# Patient Record
Sex: Female | Born: 1965 | Race: White | Hispanic: No | Marital: Single
Health system: Southern US, Community
[De-identification: ages and names within clinical notes are randomized; demographics above are authoritative.]

## PROBLEM LIST (undated history)

## (undated) DIAGNOSIS — M1990 Unspecified osteoarthritis, unspecified site: Secondary | ICD-10-CM

## (undated) DIAGNOSIS — G4733 Obstructive sleep apnea (adult) (pediatric): Secondary | ICD-10-CM

## (undated) DIAGNOSIS — N809 Endometriosis, unspecified: Secondary | ICD-10-CM

## (undated) DIAGNOSIS — F419 Anxiety disorder, unspecified: Secondary | ICD-10-CM

## (undated) HISTORY — PX: ABDOMINAL HYSTERECTOMY: SHX81

## (undated) HISTORY — PX: CHOLECYSTECTOMY: SHX55

## (undated) HISTORY — PX: OTHER SURGICAL HISTORY: SHX169

## (undated) HISTORY — PX: APPENDECTOMY: SHX54

## (undated) HISTORY — DX: Anxiety disorder, unspecified: F41.9

---
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
## 2019-04-03 DIAGNOSIS — M25571 Pain in right ankle and joints of right foot: Secondary | ICD-10-CM | POA: Diagnosis not present

## 2019-04-03 DIAGNOSIS — S99911A Unspecified injury of right ankle, initial encounter: Secondary | ICD-10-CM | POA: Diagnosis not present

## 2019-04-03 DIAGNOSIS — Z6828 Body mass index (BMI) 28.0-28.9, adult: Secondary | ICD-10-CM | POA: Diagnosis not present

## 2019-04-11 DIAGNOSIS — M9906 Segmental and somatic dysfunction of lower extremity: Secondary | ICD-10-CM | POA: Diagnosis not present

## 2019-04-11 DIAGNOSIS — M5137 Other intervertebral disc degeneration, lumbosacral region: Secondary | ICD-10-CM | POA: Diagnosis not present

## 2019-04-11 DIAGNOSIS — M25561 Pain in right knee: Secondary | ICD-10-CM | POA: Diagnosis not present

## 2019-04-11 DIAGNOSIS — M9903 Segmental and somatic dysfunction of lumbar region: Secondary | ICD-10-CM | POA: Diagnosis not present

## 2019-04-17 DIAGNOSIS — M9903 Segmental and somatic dysfunction of lumbar region: Secondary | ICD-10-CM | POA: Diagnosis not present

## 2019-04-17 DIAGNOSIS — M25561 Pain in right knee: Secondary | ICD-10-CM | POA: Diagnosis not present

## 2019-04-17 DIAGNOSIS — M9906 Segmental and somatic dysfunction of lower extremity: Secondary | ICD-10-CM | POA: Diagnosis not present

## 2019-04-17 DIAGNOSIS — M5137 Other intervertebral disc degeneration, lumbosacral region: Secondary | ICD-10-CM | POA: Diagnosis not present

## 2019-05-29 DIAGNOSIS — L700 Acne vulgaris: Secondary | ICD-10-CM | POA: Diagnosis not present

## 2019-05-29 DIAGNOSIS — L578 Other skin changes due to chronic exposure to nonionizing radiation: Secondary | ICD-10-CM | POA: Diagnosis not present

## 2019-05-29 DIAGNOSIS — Z1283 Encounter for screening for malignant neoplasm of skin: Secondary | ICD-10-CM | POA: Diagnosis not present

## 2019-06-02 DIAGNOSIS — M25579 Pain in unspecified ankle and joints of unspecified foot: Secondary | ICD-10-CM | POA: Diagnosis not present

## 2019-06-02 DIAGNOSIS — S93491A Sprain of other ligament of right ankle, initial encounter: Secondary | ICD-10-CM | POA: Diagnosis not present

## 2019-06-02 DIAGNOSIS — M79671 Pain in right foot: Secondary | ICD-10-CM | POA: Diagnosis not present

## 2019-06-16 DIAGNOSIS — M25579 Pain in unspecified ankle and joints of unspecified foot: Secondary | ICD-10-CM | POA: Diagnosis not present

## 2019-06-16 DIAGNOSIS — G5750 Tarsal tunnel syndrome, unspecified lower limb: Secondary | ICD-10-CM | POA: Diagnosis not present

## 2019-06-16 DIAGNOSIS — M79671 Pain in right foot: Secondary | ICD-10-CM | POA: Diagnosis not present

## 2019-06-27 DIAGNOSIS — G4733 Obstructive sleep apnea (adult) (pediatric): Secondary | ICD-10-CM | POA: Diagnosis not present

## 2019-07-07 DIAGNOSIS — M79671 Pain in right foot: Secondary | ICD-10-CM | POA: Diagnosis not present

## 2019-07-07 DIAGNOSIS — S93491D Sprain of other ligament of right ankle, subsequent encounter: Secondary | ICD-10-CM | POA: Diagnosis not present

## 2019-07-07 DIAGNOSIS — G5750 Tarsal tunnel syndrome, unspecified lower limb: Secondary | ICD-10-CM | POA: Diagnosis not present

## 2019-07-15 DIAGNOSIS — M25571 Pain in right ankle and joints of right foot: Secondary | ICD-10-CM | POA: Diagnosis not present

## 2019-07-15 DIAGNOSIS — M79671 Pain in right foot: Secondary | ICD-10-CM | POA: Diagnosis not present

## 2019-07-15 DIAGNOSIS — M25471 Effusion, right ankle: Secondary | ICD-10-CM | POA: Diagnosis not present

## 2019-07-22 DIAGNOSIS — S82899A Other fracture of unspecified lower leg, initial encounter for closed fracture: Secondary | ICD-10-CM | POA: Diagnosis not present

## 2019-07-22 DIAGNOSIS — H5213 Myopia, bilateral: Secondary | ICD-10-CM | POA: Diagnosis not present

## 2019-07-22 DIAGNOSIS — M25571 Pain in right ankle and joints of right foot: Secondary | ICD-10-CM | POA: Diagnosis not present

## 2019-08-19 DIAGNOSIS — M25571 Pain in right ankle and joints of right foot: Secondary | ICD-10-CM | POA: Diagnosis not present

## 2019-08-26 DIAGNOSIS — M5137 Other intervertebral disc degeneration, lumbosacral region: Secondary | ICD-10-CM | POA: Diagnosis not present

## 2019-08-26 DIAGNOSIS — M9903 Segmental and somatic dysfunction of lumbar region: Secondary | ICD-10-CM | POA: Diagnosis not present

## 2019-08-26 DIAGNOSIS — M9906 Segmental and somatic dysfunction of lower extremity: Secondary | ICD-10-CM | POA: Diagnosis not present

## 2019-08-26 DIAGNOSIS — M25561 Pain in right knee: Secondary | ICD-10-CM | POA: Diagnosis not present

## 2019-09-03 DIAGNOSIS — M5137 Other intervertebral disc degeneration, lumbosacral region: Secondary | ICD-10-CM | POA: Diagnosis not present

## 2019-09-03 DIAGNOSIS — M9903 Segmental and somatic dysfunction of lumbar region: Secondary | ICD-10-CM | POA: Diagnosis not present

## 2019-09-03 DIAGNOSIS — M9906 Segmental and somatic dysfunction of lower extremity: Secondary | ICD-10-CM | POA: Diagnosis not present

## 2019-09-03 DIAGNOSIS — M25561 Pain in right knee: Secondary | ICD-10-CM | POA: Diagnosis not present

## 2019-09-16 DIAGNOSIS — M79671 Pain in right foot: Secondary | ICD-10-CM | POA: Diagnosis not present

## 2019-09-16 DIAGNOSIS — M25571 Pain in right ankle and joints of right foot: Secondary | ICD-10-CM | POA: Diagnosis not present

## 2019-09-22 DIAGNOSIS — M25571 Pain in right ankle and joints of right foot: Secondary | ICD-10-CM | POA: Diagnosis not present

## 2019-09-25 ENCOUNTER — Encounter: Payer: Self-pay | Admitting: Physical Therapy

## 2019-09-25 ENCOUNTER — Ambulatory Visit: Payer: BC Managed Care – PPO | Attending: Orthopedic Surgery | Admitting: Physical Therapy

## 2019-09-25 ENCOUNTER — Other Ambulatory Visit: Payer: Self-pay

## 2019-09-25 DIAGNOSIS — R262 Difficulty in walking, not elsewhere classified: Secondary | ICD-10-CM | POA: Diagnosis present

## 2019-09-25 DIAGNOSIS — M25571 Pain in right ankle and joints of right foot: Secondary | ICD-10-CM | POA: Diagnosis present

## 2019-09-25 DIAGNOSIS — M25671 Stiffness of right ankle, not elsewhere classified: Secondary | ICD-10-CM

## 2019-09-25 NOTE — Therapy (Signed)
[facility] [address] [place], [place], [address] Phone: [phone]   Fax:  [phone]  Physical Therapy Evaluation  Patient Details  Name: Tiffany Mcintosh MRN: [id number] Date of Birth: 1966-03-10 Referring Provider (PT): Toni Arthurs, MD   Encounter Date: 09/25/2019  PT End of Session - 09/25/19 1243    Visit Number  1    Number of Visits  12    Date for PT Re-Evaluation  11/13/19    Authorization Type  FOTO; progress note every 10th visit    PT Start Time  1115    PT Stop Time  1202    PT Time Calculation (min)  47 min    Activity Tolerance  Patient tolerated treatment well    Behavior During Therapy  [facility] for tasks assessed/performed       History reviewed. No pertinent past medical history.  History reviewed. No pertinent surgical history.  There were no vitals filed for this visit.   Subjective Assessment - 09/25/19 1213    Subjective  COVID-19 screening performed upon arrival. Patient arrives with right ankle pain, decreased ROM, and difficulty walking due to a fall in late June 2020. Patient reported she twisted her ankle going down the steps and missed the last step and fell. Patient reports being in a boot and cast but reports being out weaned out of the boot. Patient reports pain and swelling particularly at the end of the day. Patient reports independence with ADLs but with pain. Patient has difficulties with step downs. Patient reports pain at worst as 7-8/10 and pain at best 4-5/10. Patient's goals are to decrease pain, improve movement, and improve ability to perform functional and work tasks.    Limitations  Standing;Walking;House hold activities    Diagnostic tests  x-ray: severe brusing of ankle; MRI: ligaments swollen per patient report    Patient Stated Goals  decrease pain, improve movement    Currently in Pain?  Yes    Pain Score  7     Pain Location  Ankle    Pain Orientation  Right    Pain Descriptors /  Indicators  Constant    Pain Type  Chronic pain    Pain Onset  More than a month ago    Pain Frequency  Constant    Aggravating Factors   walking    Pain Relieving Factors  motrin, rest    Effect of Pain on Daily Activities  "limited time on how long I can use it"         [facility] PT Assessment - 09/25/19 0001      Assessment   Medical Diagnosis  Pain of right ankle joint    Referring Provider (PT)  Toni Arthurs, MD    Onset Date/Surgical Date  --   June 2020   Next MD Visit  11/03/2019    Prior Therapy  no      Precautions   Precautions  None      Restrictions   Weight Bearing Restrictions  No      Balance Screen   Has the patient fallen in the past 6 months  Yes    How many times?  1    Has the patient had a decrease in activity level because of a fear of falling?   Yes    Is the patient reluctant to leave their home because of a fear of falling?   Yes      Home Environment   Living Environment  Private residence      Prior Function   Level of Independence  Independent with basic ADLs      Observation/Other Assessments   Focus on Therapeutic Outcomes (FOTO)   57% limitation      ROM / Strength   AROM / PROM / Strength  AROM;PROM      AROM   Overall AROM   Deficits    AROM Assessment Site  Ankle    Right/Left Ankle  Right    Right Ankle Dorsiflexion  -6    Right Ankle Plantar Flexion  48    Right Ankle Inversion  22    Right Ankle Eversion  10      PROM   Overall PROM   Deficits    PROM Assessment Site  Ankle    Right/Left Ankle  Right    Right Ankle Dorsiflexion  0    Right Ankle Plantar Flexion  52    Right Ankle Inversion  30    Right Ankle Eversion  14      Palpation   Palpation comment  Patient tender to palpation to medial aspect of right calf, slight tenderness to lateral ankle      Ambulation/Gait   Gait Pattern  Step-through pattern;Decreased stance time - right;Decreased step length - left;Decreased stride length;Decreased dorsiflexion -  right;Decreased weight shift to right    Ambulation Surface  Level;Indoor                Objective measurements completed on examination: See above findings.      [facility] Adult PT Treatment/Exercise - 09/25/19 0001      Modalities   Modalities  Electrical Stimulation;Vasopneumatic      Acupuncturist Location  right ankle    Electrical Stimulation Action  IFC    Electrical Stimulation Parameters  80-150 hz x10 mins    Electrical Stimulation Goals  Pain      Vasopneumatic   Number Minutes Vasopneumatic   10 minutes    Vasopnuematic Location   Ankle    Vasopneumatic Pressure  Low    Vasopneumatic Temperature   40             PT Education - 09/25/19 1242    Education Details  ankle pumps, ankle circles, seated heel raises, seated toe raises    Person(s) Educated  Patient    Methods  Explanation;Demonstration;Handout    Comprehension  Verbalized understanding;Returned demonstration          PT Long Term Goals - 09/25/19 1244      PT LONG TERM GOAL #1   Title  Patient will be independent with HEP    Time  6    Period  Weeks    Status  New      PT LONG TERM GOAL #2   Title  Patient will demonstrate 6+ degrees of right ankle DF AROM to improve gait mechanics.    Time  6    Period  Weeks    Status  New      PT LONG TERM GOAL #3   Title  Patient will report ability to walk community distances with minimal gait deviations and right ankle pain less than or equal to 4/10.    Time  6    Period  Weeks    Status  New      PT LONG TERM GOAL #4   Title  Patient will demonstrate 4+/5 or greater of right ankle MMT to  improve stability during functional tasks.    Time  6    Period  Weeks    Status  New      PT LONG TERM GOAL #5   Title  Patient will report ability to perform ADLs and work activities with pain less than or equal to 4/10.    Time  6    Period  Weeks    Status  New             Plan - 09/25/19 1402     Clinical Impression Statement  Patient is a 53 year old female who presents to physical therapy with right ankle pain, decreased right ankle ROM, and abnormal gait mechanics due to a fall in June 2020. Patient noted with increased right ankle edema in comparison to unaffected. Patient noted with tenderness in right medial aspect of the calf and slight tenderness in lateral ankle. Patient ambulates with a step through gait, decreased right stance time, decreased left stance time, and decreased right DF during toe off. Patient and PT discussed HEP as well as plan of care to address right ankle. Patient reported understanding. Patient would benefit from skilled physical therapy to address deficits and address patient's goals.    Personal Factors and Comorbidities  Age    Examination-Activity Limitations  Locomotion Level;Transfers;Squat;Stairs;Stand    Stability/Clinical Decision Making  Stable/Uncomplicated    Clinical Decision Making  Low    Rehab Potential  Good    PT Frequency  2x / week    PT Duration  6 weeks    PT Treatment/Interventions  ADLs/Self Care Home Management;Iontophoresis 4mg /ml Dexamethasone;Moist Heat;Ultrasound;Cryotherapy;Retail bankerlectrical Stimulation;Gait training;Stair training;Therapeutic activities;Functional mobility training;Therapeutic exercise;Balance training;Neuromuscular re-education;Patient/family education;Passive range of motion;Vasopneumatic Device;Taping;Manual techniques    PT Next Visit Plan  Nustep, right ankle AROM, PROM, gentle strengthening, modalities PRN for pain relief    PT Home Exercise Plan  see patient education section.    Consulted and Agree with Plan of Care  Patient       Patient will benefit from skilled therapeutic intervention in order to improve the following deficits and impairments:  Abnormal gait, Pain, Decreased activity tolerance, Decreased balance, Increased edema, Decreased strength, Difficulty walking, Decreased range of motion  Visit  Diagnosis: Stiffness of right ankle, not elsewhere classified - Plan: PT plan of care cert/re-cert  Pain in right ankle and joints of right foot - Plan: PT plan of care cert/re-cert  Difficulty in walking, not elsewhere classified - Plan: PT plan of care cert/re-cert     Problem List There are no active problems to display for this patient.   Guss BundeKrystle Jakhai Fant, PT, DPT 09/25/2019, 2:09 PM  [facility] [address] [place], [place], [address] Phone: [phone]   Fax:  [phone]  Name: Tiffany Mcintosh MRN: [id number] Date of Birth: 1965/12/27

## 2019-09-29 ENCOUNTER — Other Ambulatory Visit: Payer: Self-pay

## 2019-09-29 ENCOUNTER — Ambulatory Visit: Payer: BC Managed Care – PPO | Admitting: Physical Therapy

## 2019-09-29 DIAGNOSIS — M25671 Stiffness of right ankle, not elsewhere classified: Secondary | ICD-10-CM

## 2019-09-29 DIAGNOSIS — R262 Difficulty in walking, not elsewhere classified: Secondary | ICD-10-CM

## 2019-09-29 DIAGNOSIS — M25571 Pain in right ankle and joints of right foot: Secondary | ICD-10-CM

## 2019-09-29 NOTE — Therapy (Signed)
[facility] [place], [place], [address] Phone: [phone]   Fax:  [phone]  Physical Therapy Treatment  Patient Details  Name: Tiffany Mcintosh MRN: [id number] Date of Birth: February 18, 1966 Referring Provider (PT): Wylene Simmer, MD   Encounter Date: 09/29/2019  PT End of Session - 09/29/19 1131    Visit Number  2    Number of Visits  12    Date for PT Re-Evaluation  11/13/19    Authorization Type  FOTO; progress note every 10th visit    PT Start Time  1115    PT Stop Time  1206    PT Time Calculation (min)  51 min    Activity Tolerance  Patient tolerated treatment well    Behavior During Therapy  [facility] for tasks assessed/performed       No past medical history on file.  No past surgical history on file.  There were no vitals filed for this visit.  Subjective Assessment - 09/29/19 1129    Subjective  COVID-19 screening performed upon arrival. Patient reports ongoing pain in medial aspect of the right ankle. Patient reports pain can increase and she uses the cane fore support while walking to prevent falls.    Limitations  Standing;Walking;House hold activities    Diagnostic tests  x-ray: severe brusing of ankle; MRI: ligaments swollen per patient report    Patient Stated Goals  decrease pain, improve movement    Currently in Pain?  Yes    Pain Score  7     Pain Location  Ankle    Pain Orientation  Right;Medial    Pain Descriptors / Indicators  Sharp;Aching;Constant    Pain Type  Chronic pain    Pain Onset  More than a month ago    Pain Frequency  Constant         [facility] PT Assessment - 09/29/19 0001      Assessment   Medical Diagnosis  Pain of right ankle joint    Referring Provider (PT)  Wylene Simmer, MD    Next MD Visit  11/03/2019    Prior Therapy  no      Precautions   Precautions  None                   [facility] Adult PT Treatment/Exercise - 09/29/19 0001      Exercises   Exercises  Ankle       Modalities   Modalities  Electrical Stimulation;Vasopneumatic      Electrical Stimulation   Electrical Stimulation Location  right ankle     Electrical Stimulation Action  IFC    Electrical Stimulation Parameters  80-150 hz x10 mins    Electrical Stimulation Goals  Pain      Vasopneumatic   Number Minutes Vasopneumatic   10 minutes    Vasopnuematic Location   Ankle    Vasopneumatic Pressure  Low    Vasopneumatic Temperature   56      Manual Therapy   Manual Therapy  Soft tissue mobilization;Passive ROM    Soft tissue mobilization  STW/M distal to proximal pressure to decrease edema    Passive ROM  gentle PROM into DF to improve range.      Ankle Exercises: Aerobic   Nustep  Level 2 x10 mins, Seat 11 to 9      Ankle Exercises: Seated   Ankle Circles/Pumps  AROM;Right;20 reps    Ankle Circles/Pumps Limitations  on dynadisc    Other Seated  Ankle Exercises  rockerboard AP and lateral 3 mins each                  PT Long Term Goals - 09/25/19 1244      PT LONG TERM GOAL #1   Title  Patient will be independent with HEP    Time  6    Period  Weeks    Status  New      PT LONG TERM GOAL #2   Title  Patient will demonstrate 6+ degrees of right ankle DF AROM to improve gait mechanics.    Time  6    Period  Weeks    Status  New      PT LONG TERM GOAL #3   Title  Patient will report ability to walk community distances with minimal gait deviations and right ankle pain less than or equal to 4/10.    Time  6    Period  Weeks    Status  New      PT LONG TERM GOAL #4   Title  Patient will demonstrate 4+/5 or greater of right ankle MMT to improve stability during functional tasks.    Time  6    Period  Weeks    Status  New      PT LONG TERM GOAL #5   Title  Patient will report ability to perform ADLs and work activities with pain less than or equal to 4/10.    Time  6    Period  Weeks    Status  New            Plan - 09/29/19 1158    Clinical  Impression Statement  Patient responded well to therapy session with no reports of increased pain. Patient demonstrated good form with all seated exercises. Patient noted with increased right ankle swelling and decreased DF PROM. No adverse affects upon removal of modalities.    Personal Factors and Comorbidities  Age    Examination-Activity Limitations  Locomotion Level;Transfers;Squat;Stairs;Stand    Stability/Clinical Decision Making  Stable/Uncomplicated    Clinical Decision Making  Low    Rehab Potential  Good    PT Frequency  2x / week    PT Duration  6 weeks    PT Treatment/Interventions  ADLs/Self Care Home Management;Iontophoresis 4mg /ml Dexamethasone;Moist Heat;Ultrasound;Cryotherapy; ;Therapeutic activities;Functional mobility training;Therapeutic exercise;Balance training;Neuromuscular re-education;Patient/family education;Passive range of motion;Vasopneumatic Device;Taping;Manual techniques    PT Next Visit Plan  Nustep, right ankle AROM, PROM, gentle strengthening, modalities PRN for pain relief    Consulted and Agree with Plan of Care  Patient       Patient will benefit from skilled therapeutic intervention in order to improve the following deficits and impairments:  Abnormal gait, Pain, Decreased activity tolerance, Decreased balance, Increased edema, Decreased strength, Difficulty walking, Decreased range of motion  Visit Diagnosis: Stiffness of right ankle, not elsewhere classified  Pain in right ankle and joints of right foot  Difficulty in walking, not elsewhere classified     Problem List There are no active problems to display for this patient.   Retail banker, PT, DPT 09/29/2019, 12:16 PM  [facility] [address] [place], [place], [place] Phone: [phone]   Fax:  [phone]  Name: Tiffany Mcintosh MRN: [name] Date of Birth: September 28, 1966

## 2019-10-02 ENCOUNTER — Other Ambulatory Visit: Payer: Self-pay

## 2019-10-02 ENCOUNTER — Ambulatory Visit: Payer: BC Managed Care – PPO | Admitting: Physical Therapy

## 2019-10-02 ENCOUNTER — Encounter: Payer: Self-pay | Admitting: Physical Therapy

## 2019-10-02 DIAGNOSIS — M25671 Stiffness of right ankle, not elsewhere classified: Secondary | ICD-10-CM | POA: Diagnosis not present

## 2019-10-02 DIAGNOSIS — M25571 Pain in right ankle and joints of right foot: Secondary | ICD-10-CM

## 2019-10-02 DIAGNOSIS — R262 Difficulty in walking, not elsewhere classified: Secondary | ICD-10-CM

## 2019-10-02 NOTE — Therapy (Signed)
[facility] [address] [place], [place], [address] Phone: [phone]   Fax:  [phone]  Physical Therapy Treatment  Patient Details  Name: Tiffany Mcintosh MRN: [id number] Date of Birth: [birth year] Referring Provider (PT): Toni Arthurs, MD   Encounter Date: 10/02/2019  PT End of Session - 10/02/19 1118    Visit Number  3    Number of Visits  12    Date for PT Re-Evaluation  11/13/19    Authorization Type  FOTO; progress note every 10th visit    PT Start Time  1116    PT Stop Time  1204    PT Time Calculation (min)  48 min    Activity Tolerance  Patient tolerated treatment well    Behavior During Therapy  [facility] for tasks assessed/performed       History reviewed. No pertinent past medical history.  History reviewed. No pertinent surgical history.  There were no vitals filed for this visit.  Subjective Assessment - 10/02/19 1117    Subjective  COVID 19 screening performed on patient upon arrival. Patient reports ankle is better.    Limitations  Standing;Walking;House hold activities    Diagnostic tests  x-ray: severe brusing of ankle; MRI: ligaments swollen per patient report    Currently in Pain?  Yes    Pain Score  5     Pain Location  Ankle    Pain Orientation  Right;Medial    Pain Descriptors / Indicators  Constant;Aching;Sore;Tightness    Pain Type  Chronic pain    Pain Onset  More than a month ago    Pain Frequency  Constant         [facility] PT Assessment - 10/02/19 0001      Assessment   Medical Diagnosis  Pain of right ankle joint    Referring Provider (PT)  Toni Arthurs, MD    Next MD Visit  11/03/2019    Prior Therapy  no      Precautions   Precautions  None      Restrictions   Weight Bearing Restrictions  No                   [facility] Adult PT Treatment/Exercise - 10/02/19 0001      Modalities   Modalities  Electrical Stimulation;Vasopneumatic      Electrical Stimulation   Electrical  Stimulation Location  R ankle    Electrical Stimulation Action  IFC    Electrical Stimulation Parameters  80-150 hz x15 min    Electrical Stimulation Goals  Pain;Edema      Vasopneumatic   Number Minutes Vasopneumatic   15 minutes    Vasopnuematic Location   Ankle    Vasopneumatic Pressure  Low    Vasopneumatic Temperature   34      Ankle Exercises: Aerobic   Nustep  L3 x60min      Ankle Exercises: Seated   ABC's  1 rep    Heel Raises  Both;20 reps    Toe Raise  20 reps    Other Seated Ankle Exercises  Rockerboard ant/post x3 min, lat/med x3 min    Other Seated Ankle Exercises  Dyandisc ant/post x2 min, circles x2 min                  PT Long Term Goals - 09/25/19 1244      PT LONG TERM GOAL #1   Title  Patient will be independent with HEP    Time  6    Period  Weeks    Status  New      PT LONG TERM GOAL #2   Title  Patient will demonstrate 6+ degrees of right ankle DF AROM to improve gait mechanics.    Time  6    Period  Weeks    Status  New      PT LONG TERM GOAL #3   Title  Patient will report ability to walk community distances with minimal gait deviations and right ankle pain less than or equal to 4/10.    Time  6    Period  Weeks    Status  New      PT LONG TERM GOAL #4   Title  Patient will demonstrate 4+/5 or greater of right ankle MMT to improve stability during functional tasks.    Time  6    Period  Weeks    Status  New      PT LONG TERM GOAL #5   Title  Patient will report ability to perform ADLs and work activities with pain less than or equal to 4/10.    Time  6    Period  Weeks    Status  New            Plan - 10/02/19 1201    Clinical Impression Statement  Patient presented in clinic with reports of overall improvement since last treatment. Patient noted improvement in R ankle edema since previous PT treatment and edema massage. Patient reported intermittant R fifth metatarsal pain and aching into R posteriolateral calf.  Minimal to moderate edema still notable around B ankle malleoli and into inferior calf region. Normal modalities response noted following removal of the modalities.    Personal Factors and Comorbidities  Age    Examination-Activity Limitations  Locomotion Level;Transfers;Squat;Stairs;Stand    Stability/Clinical Decision Making  Stable/Uncomplicated    Rehab Potential  Good    PT Frequency  2x / week    PT Duration  6 weeks    PT Treatment/Interventions  ADLs/Self Care Home Management;Iontophoresis 4mg /ml Dexamethasone;Moist Heat;Ultrasound;Cryotherapy;Occupational psychologist;Therapeutic activities;Functional mobility training;Therapeutic exercise;Balance training;Neuromuscular re-education;Patient/family education;Passive range of motion;Vasopneumatic Device;Taping;Manual techniques    PT Next Visit Plan  Nustep, right ankle AROM, PROM, gentle strengthening, modalities PRN for pain relief    PT Home Exercise Plan  see patient education section.    Consulted and Agree with Plan of Care  Patient       Patient will benefit from skilled therapeutic intervention in order to improve the following deficits and impairments:  Abnormal gait, Pain, Decreased activity tolerance, Decreased balance, Increased edema, Decreased strength, Difficulty walking, Decreased range of motion  Visit Diagnosis: Stiffness of right ankle, not elsewhere classified  Pain in right ankle and joints of right foot  Difficulty in walking, not elsewhere classified     Problem List There are no active problems to display for this patient.   Standley Brooking, PTA 10/02/2019, 12:11 PM  [facility] [address] [place], [place], [address] Phone: [phone]   Fax:  [phone]  Name: Maris Abascal MRN: [id number] Date of Birth: 02/10/1966

## 2019-10-07 ENCOUNTER — Other Ambulatory Visit: Payer: Self-pay

## 2019-10-07 ENCOUNTER — Ambulatory Visit: Payer: BC Managed Care – PPO | Admitting: Physical Therapy

## 2019-10-07 ENCOUNTER — Encounter: Payer: Self-pay | Admitting: Physical Therapy

## 2019-10-07 DIAGNOSIS — M25671 Stiffness of right ankle, not elsewhere classified: Secondary | ICD-10-CM | POA: Diagnosis not present

## 2019-10-07 DIAGNOSIS — M25571 Pain in right ankle and joints of right foot: Secondary | ICD-10-CM

## 2019-10-07 DIAGNOSIS — R262 Difficulty in walking, not elsewhere classified: Secondary | ICD-10-CM

## 2019-10-07 NOTE — Therapy (Signed)
[facility] [place], [place], [address] Phone: [phone]   Fax:  [phone]  Physical Therapy Treatment  Patient Details  Name: Tiffany Mcintosh MRN: [id number] Date of Birth: 12-23-1965 Referring Provider (PT): Wylene Simmer, MD   Encounter Date: 10/07/2019  PT End of Session - 10/07/19 1246    Visit Number  4    Number of Visits  12    Date for PT Re-Evaluation  11/13/19    Authorization Type  FOTO; progress note every 10th visit    PT Start Time  1115    PT Stop Time  1202    PT Time Calculation (min)  47 min    Activity Tolerance  Patient tolerated treatment well    Behavior During Therapy  [facility] for tasks assessed/performed       History reviewed. No pertinent past medical history.  History reviewed. No pertinent surgical history.  There were no vitals filed for this visit.  Subjective Assessment - 10/07/19 1118    Subjective  COVID 19 screening performed on patient upon arrival. Patient reports ankle feels better for 3-4 days after PT then pain and discomfort returns.    Limitations  Standing;Walking;House hold activities    Diagnostic tests  x-ray: severe brusing of ankle; MRI: ligaments swollen per patient report    Patient Stated Goals  decrease pain, improve movement    Currently in Pain?  Yes    Pain Score  5     Pain Location  Ankle    Pain Orientation  Right;Medial    Pain Descriptors / Indicators  Constant    Pain Type  Chronic pain    Pain Onset  More than a month ago    Pain Frequency  Constant         [facility] PT Assessment - 10/07/19 0001      Assessment   Medical Diagnosis  Pain of right ankle joint    Referring Provider (PT)  Wylene Simmer, MD    Next MD Visit  11/03/2019    Prior Therapy  no      Precautions   Precautions  None                   [facility] Adult PT Treatment/Exercise - 10/07/19 0001      Modalities   Modalities  Electrical Stimulation;Vasopneumatic       Electrical Stimulation   Electrical Stimulation Location  R ankle    Electrical Stimulation Action  IFC    Electrical Stimulation Parameters  80-150 hz x15 mins    Electrical Stimulation Goals  Pain      Vasopneumatic   Number Minutes Vasopneumatic   15 minutes    Vasopnuematic Location   Ankle    Vasopneumatic Pressure  Low    Vasopneumatic Temperature   34      Ankle Exercises: Aerobic   Nustep  L5 x10 mins      Ankle Exercises: Seated   Other Seated Ankle Exercises  Rockerboard ant/post x3 min, lat/med x3 min      Ankle Exercises: Stretches   Soleus Stretch  4 reps;30 seconds    Gastroc Stretch  4 reps;30 seconds                  PT Long Term Goals - 09/25/19 1244      PT LONG TERM GOAL #1   Title  Patient will be independent with HEP    Time  6  Period  Weeks    Status  New      PT LONG TERM GOAL #2   Title  Patient will demonstrate 6+ degrees of right ankle DF AROM to improve gait mechanics.    Time  6    Period  Weeks    Status  New      PT LONG TERM GOAL #3   Title  Patient will report ability to walk community distances with minimal gait deviations and right ankle pain less than or equal to 4/10.    Time  6    Period  Weeks    Status  New      PT LONG TERM GOAL #4   Title  Patient will demonstrate 4+/5 or greater of right ankle MMT to improve stability during functional tasks.    Time  6    Period  Weeks    Status  New      PT LONG TERM GOAL #5   Title  Patient will report ability to perform ADLs and work activities with pain less than or equal to 4/10.    Time  6    Period  Weeks    Status  New            Plan - 10/07/19 1246    Clinical Impression Statement  Patient arrives with ongoing 5/10 pain. Patient was able to peform all TEs with minimal complaints. Patient and PT discussed new HEP consisting of towel scrunches and gastroc and soleus stretching. Patient demonstrated good form with stretching. Patient and PT discussed  continuing to ice and elevate at home to address pain and edema. Patient reported understanding. No adverse affects upon removal of modalities.    Personal Factors and Comorbidities  Age    Examination-Activity Limitations  Locomotion Level;Transfers;Squat;Stairs;Stand    Stability/Clinical Decision Making  Stable/Uncomplicated    Clinical Decision Making  Low    Rehab Potential  Good    PT Frequency  2x / week    PT Duration  6 weeks    PT Treatment/Interventions  ADLs/Self Care Home Management;Iontophoresis 4mg /ml Dexamethasone;Moist Heat;Ultrasound;Cryotherapy; ;Therapeutic activities;Functional mobility training;Therapeutic exercise;Balance training;Neuromuscular re-education;Patient/family education;Passive range of motion;Vasopneumatic Device;Taping;Manual techniques    PT Next Visit Plan  Nustep, right ankle AROM, PROM, gentle strengthening, modalities PRN for pain relief    PT Home Exercise Plan  see patient education section.    Consulted and Agree with Plan of Care  Patient       Patient will benefit from skilled therapeutic intervention in order to improve the following deficits and impairments:  Abnormal gait, Pain, Decreased activity tolerance, Decreased balance, Increased edema, Decreased strength, Difficulty walking, Decreased range of motion  Visit Diagnosis: Stiffness of right ankle, not elsewhere classified  Pain in right ankle and joints of right foot  Difficulty in walking, not elsewhere classified     Problem List There are no active problems to display for this patient.   Retail banker, PT, DPT 10/07/2019, 12:55 PM  [facility] [address] [place], [place], [place] Phone: [phone]   Fax:  [phone]  Name: Tiffany Mcintosh MRN: [name] Date of Birth: [birth year]

## 2019-10-13 ENCOUNTER — Ambulatory Visit: Payer: BC Managed Care – PPO | Admitting: Physical Therapy

## 2019-10-14 ENCOUNTER — Ambulatory Visit: Payer: BC Managed Care – PPO | Attending: Orthopedic Surgery | Admitting: Physical Therapy

## 2019-10-14 ENCOUNTER — Encounter: Payer: Self-pay | Admitting: Physical Therapy

## 2019-10-14 ENCOUNTER — Other Ambulatory Visit: Payer: Self-pay

## 2019-10-14 DIAGNOSIS — R262 Difficulty in walking, not elsewhere classified: Secondary | ICD-10-CM

## 2019-10-14 DIAGNOSIS — M25671 Stiffness of right ankle, not elsewhere classified: Secondary | ICD-10-CM

## 2019-10-14 DIAGNOSIS — M25571 Pain in right ankle and joints of right foot: Secondary | ICD-10-CM

## 2019-10-14 DIAGNOSIS — S82899A Other fracture of unspecified lower leg, initial encounter for closed fracture: Secondary | ICD-10-CM | POA: Diagnosis not present

## 2019-10-14 DIAGNOSIS — H5213 Myopia, bilateral: Secondary | ICD-10-CM | POA: Diagnosis not present

## 2019-10-14 NOTE — Therapy (Signed)
[facility] [address] [place], [place], [address] Phone: [phone]   Fax:  [phone]  Physical Therapy Treatment  Patient Details  Name: Tiffany Mcintosh MRN: [id number] Date of Birth: 03-08-1966 Referring Provider (PT): Toni Arthurs, MD   Encounter Date: 10/14/2019  PT End of Session - 10/14/19 1103    Visit Number  5    Number of Visits  12    Date for PT Re-Evaluation  11/13/19    Authorization Type  FOTO; progress note every 10th visit    PT Start Time  1031    PT Stop Time  1119    PT Time Calculation (min)  48 min    Activity Tolerance  Patient tolerated treatment well    Behavior During Therapy  [facility] for tasks assessed/performed       History reviewed. No pertinent past medical history.  History reviewed. No pertinent surgical history.  There were no vitals filed for this visit.  Subjective Assessment - 10/14/19 1102    Subjective  COVID 19 screening performed on patient upon arrival. Patient reports that her ankle has been feeling some better. Has been trying to do massages to reduce edema. Wearing tennis shoes upon arrival and reporting tightness.    Limitations  Standing;Walking;House hold activities    Diagnostic tests  x-ray: severe brusing of ankle; MRI: ligaments swollen per patient report    Patient Stated Goals  decrease pain, improve movement    Currently in Pain?  Other (Comment)   No pain assessment provided by patient        [facility] PT Assessment - 10/14/19 0001      Assessment   Medical Diagnosis  Pain of right ankle joint    Referring Provider (PT)  Toni Arthurs, MD    Next MD Visit  11/03/2019    Prior Therapy  no      Precautions   Precautions  None                   [facility] Adult PT Treatment/Exercise - 10/14/19 0001      Modalities   Modalities  Electrical Stimulation;Vasopneumatic      Electrical Stimulation   Electrical Stimulation Location  R ankle    Electrical  Stimulation Action  Pre-Mod    Electrical Stimulation Parameters  80-150 hz x15 min    Electrical Stimulation Goals  Pain;Edema      Vasopneumatic   Number Minutes Vasopneumatic   15 minutes    Vasopnuematic Location   Ankle    Vasopneumatic Pressure  Low    Vasopneumatic Temperature   34      Ankle Exercises: Aerobic   Nustep  L5 x10 mins      Ankle Exercises: Standing   Rocker Board  3 minutes    Heel Raises  Both;10 reps   reported fatigue   Toe Raise  15 reps    Other Standing Ankle Exercises  R lunges x20 reps    Other Standing Ankle Exercises  R forward step up 6" x20 reps                  PT Long Term Goals - 09/25/19 1244      PT LONG TERM GOAL #1   Title  Patient will be independent with HEP    Time  6    Period  Weeks    Status  New      PT LONG TERM GOAL #2   Title  Patient will demonstrate 6+ degrees of right ankle DF AROM to improve gait mechanics.    Time  6    Period  Weeks    Status  New      PT LONG TERM GOAL #3   Title  Patient will report ability to walk community distances with minimal gait deviations and right ankle pain less than or equal to 4/10.    Time  6    Period  Weeks    Status  New      PT LONG TERM GOAL #4   Title  Patient will demonstrate 4+/5 or greater of right ankle MMT to improve stability during functional tasks.    Time  6    Period  Weeks    Status  New      PT LONG TERM GOAL #5   Title  Patient will report ability to perform ADLs and work activities with pain less than or equal to 4/10.    Time  6    Period  Weeks    Status  New            Plan - 10/14/19 1212    Clinical Impression Statement  Patient presented in clinic with reports of overall improvement in regards to R ankle. Patient guided through more standing exercises today with patient reporting more medial ankle pull during forward lunges. Patient indicated more discomfort with eccentric control as well. Less foot edema observed and mostly at  tibiofibular region. Normal modalities response noted following removal of the modalities.    Personal Factors and Comorbidities  Age    Examination-Activity Limitations  Locomotion Level;Transfers;Squat;Stairs;Stand    Stability/Clinical Decision Making  Stable/Uncomplicated    Rehab Potential  Good    PT Frequency  2x / week    PT Duration  6 weeks    PT Treatment/Interventions  ADLs/Self Care Home Management;Iontophoresis 4mg /ml Dexamethasone;Moist Heat;Ultrasound;Cryotherapy;Occupational psychologist;Therapeutic activities;Functional mobility training;Therapeutic exercise;Balance training;Neuromuscular re-education;Patient/family education;Passive range of motion;Vasopneumatic Device;Taping;Manual techniques    PT Next Visit Plan  Nustep, right ankle AROM, PROM, gentle strengthening, modalities PRN for pain relief    PT Home Exercise Plan  see patient education section.    Consulted and Agree with Plan of Care  Patient       Patient will benefit from skilled therapeutic intervention in order to improve the following deficits and impairments:  Abnormal gait, Pain, Decreased activity tolerance, Decreased balance, Increased edema, Decreased strength, Difficulty walking, Decreased range of motion  Visit Diagnosis: Stiffness of right ankle, not elsewhere classified  Pain in right ankle and joints of right foot  Difficulty in walking, not elsewhere classified     Problem List There are no active problems to display for this patient.   Standley Brooking, PTA 10/14/2019, 12:21 PM  [facility] [address] [place], [place], [address] Phone: [phone]   Fax:  [phone]  Name: Tiffany Mcintosh MRN: [id number] Date of Birth: 12/28/1965

## 2019-10-16 ENCOUNTER — Ambulatory Visit: Payer: BC Managed Care – PPO | Admitting: *Deleted

## 2019-10-16 ENCOUNTER — Other Ambulatory Visit: Payer: Self-pay

## 2019-10-16 DIAGNOSIS — M25571 Pain in right ankle and joints of right foot: Secondary | ICD-10-CM

## 2019-10-16 DIAGNOSIS — M25671 Stiffness of right ankle, not elsewhere classified: Secondary | ICD-10-CM

## 2019-10-16 DIAGNOSIS — R262 Difficulty in walking, not elsewhere classified: Secondary | ICD-10-CM

## 2019-10-16 NOTE — Therapy (Signed)
[facility] [place], [place], [address] Phone: [phone]   Fax:  [phone]  Physical Therapy Treatment  Patient Details  Name: Tiffany Mcintosh MRN: [id number] Date of Birth: 04-08-1966 Referring Provider (PT): Wylene Simmer, MD   Encounter Date: 10/16/2019  PT End of Session - 10/16/19 1121    Visit Number  6    Number of Visits  12    Date for PT Re-Evaluation  11/13/19    Authorization Type  FOTO; progress note every 10th visit    PT Start Time  1115    PT Stop Time  1210    PT Time Calculation (min)  55 min       No past medical history on file.  No past surgical history on file.  There were no vitals filed for this visit.  Subjective Assessment - 10/16/19 1116    Subjective  COVID 19 screening performed on patient upon arrival. Patient reports that her ankle has been feeling some better. Had pain this morning shooting in my ankle and into leg, but went away after being up    Limitations  Standing;Walking;House hold activities    Diagnostic tests  x-ray: severe brusing of ankle; MRI: ligaments swollen per patient report    Patient Stated Goals  decrease pain, improve movement    Currently in Pain?  Yes    Pain Score  4     Pain Location  Ankle    Pain Orientation  Right;Medial    Pain Descriptors / Indicators  Constant    Pain Onset  More than a month ago                       [facility] Adult PT Treatment/Exercise - 10/16/19 0001      Modalities   Modalities  Electrical Stimulation;Vasopneumatic      Acupuncturist Location  R ankle    Electrical Stimulation Action  IFC    Electrical Stimulation Parameters  80-150hz  x 15 mins    Electrical Stimulation Goals  Edema;Pain      Vasopneumatic   Number Minutes Vasopneumatic   15 minutes    Vasopnuematic Location   Ankle    Vasopneumatic Pressure  Low    Vasopneumatic Temperature   34      Manual Therapy    Manual Therapy  Passive ROM    Passive ROM  gentle PROM into DF to improve range.      Ankle Exercises: Aerobic   Nustep  L5 x10 mins      Ankle Exercises: Seated   Other Seated Ankle Exercises  Standing  Rockerboard ant/post x3 min, Dynadisc 2x10 DF/PF, CW,CCW       Ankle Exercises: Standing   Heel Raises  Both;10 reps    Toe Raise  20 reps    Other Standing Ankle Exercises  Airex balance pad wt shifting forward/ back. then eyes closed x 3 bouts.    Other Standing Ankle Exercises  R forward step down 4"  x 10reps                  PT Long Term Goals - 09/25/19 1244      PT LONG TERM GOAL #1   Title  Patient will be independent with HEP    Time  6    Period  Weeks    Status  New      PT LONG TERM GOAL #2  Title  Patient will demonstrate 6+ degrees of right ankle DF AROM to improve gait mechanics.    Time  6    Period  Weeks    Status  New      PT LONG TERM GOAL #3   Title  Patient will report ability to walk community distances with minimal gait deviations and right ankle pain less than or equal to 4/10.    Time  6    Period  Weeks    Status  New      PT LONG TERM GOAL #4   Title  Patient will demonstrate 4+/5 or greater of right ankle MMT to improve stability during functional tasks.    Time  6    Period  Weeks    Status  New      PT LONG TERM GOAL #5   Title  Patient will report ability to perform ADLs and work activities with pain less than or equal to 4/10.    Time  6    Period  Weeks    Status  New            Plan - 10/16/19 1122    Clinical Impression Statement  Pt arrived today with some increased soreness/pain RT foot.She was able to perform standing exs again for proprioception and light strengthening. She did well with PROM for DF and able to reach AROM to 2 degrees. Normal modality response.    Personal Factors and Comorbidities  Age    Stability/Clinical Decision Making  Stable/Uncomplicated    Rehab Potential  Good    PT Frequency   2x / week    PT Duration  6 weeks    PT Treatment/Interventions  ADLs/Self Care Home Management;Iontophoresis 4mg /ml Dexamethasone;Moist Heat;Ultrasound;Cryotherapy; ;Therapeutic activities;Functional mobility training;Therapeutic exercise;Balance training;Neuromuscular re-education;Patient/family education;Passive range of motion;Vasopneumatic Device;Taping;Manual techniques    PT Next Visit Plan  Nustep, right ankle AROM, PROM, gentle strengthening, modalities PRN for pain relief    PT Home Exercise Plan  see patient education section.       Patient will benefit from skilled therapeutic intervention in order to improve the following deficits and impairments:  Abnormal gait, Pain, Decreased activity tolerance, Decreased balance, Increased edema, Decreased strength, Difficulty walking, Decreased range of motion  Visit Diagnosis: Stiffness of right ankle, not elsewhere classified  Pain in right ankle and joints of right foot  Difficulty in walking, not elsewhere classified     Problem List There are no active problems to display for this patient.   RAMSEUR,CHRIS, PTA 10/16/2019, 6:01 PM  [facility] [address] [place], [place], [place] Phone: [phone]   Fax:  [phone]  Name: Tiffany Mcintosh MRN: [name] Date of Birth: Mar 04, 1966

## 2019-10-20 ENCOUNTER — Encounter: Payer: BC Managed Care – PPO | Admitting: Physical Therapy

## 2019-10-20 DIAGNOSIS — R0981 Nasal congestion: Secondary | ICD-10-CM | POA: Diagnosis not present

## 2019-10-20 DIAGNOSIS — J0100 Acute maxillary sinusitis, unspecified: Secondary | ICD-10-CM | POA: Diagnosis not present

## 2019-10-23 ENCOUNTER — Ambulatory Visit: Payer: BC Managed Care – PPO | Admitting: Physical Therapy

## 2019-10-23 ENCOUNTER — Encounter: Payer: Self-pay | Admitting: Physical Therapy

## 2019-10-23 ENCOUNTER — Other Ambulatory Visit: Payer: Self-pay

## 2019-10-23 DIAGNOSIS — M25671 Stiffness of right ankle, not elsewhere classified: Secondary | ICD-10-CM

## 2019-10-23 DIAGNOSIS — M25571 Pain in right ankle and joints of right foot: Secondary | ICD-10-CM

## 2019-10-23 DIAGNOSIS — R262 Difficulty in walking, not elsewhere classified: Secondary | ICD-10-CM

## 2019-10-23 NOTE — Therapy (Signed)
[facility] [place], [place], [address] Phone: [phone]   Fax:  [phone]  Physical Therapy Treatment  Patient Details  Name: Tiffany Mcintosh MRN: [id number] Date of Birth: Feb 09, 1966 Referring Provider (PT): Wylene Simmer, MD   Encounter Date: 10/23/2019  PT End of Session - 10/23/19 1113    Visit Number  7    Number of Visits  12    Date for PT Re-Evaluation  11/13/19    Authorization Type  FOTO; progress note every 10th visit    PT Start Time  1031    PT Stop Time  1112    PT Time Calculation (min)  41 min    Activity Tolerance  Patient tolerated treatment well    Behavior During Therapy  [facility] for tasks assessed/performed       History reviewed. No pertinent past medical history.  History reviewed. No pertinent surgical history.  There were no vitals filed for this visit.  Subjective Assessment - 10/23/19 1038    Subjective  COVID 19 screening performed on patient upon arrival. Patient reported less swelling in ankle and some pain, now pain in knee also today    Limitations  Standing;Walking;House hold activities    Diagnostic tests  x-ray: severe brusing of ankle; MRI: ligaments swollen per patient report    Patient Stated Goals  decrease pain, improve movement    Currently in Pain?  Yes    Pain Score  2     Pain Location  Ankle    Pain Orientation  Right;Medial    Pain Descriptors / Indicators  Discomfort    Pain Type  Chronic pain    Pain Onset  More than a month ago    Aggravating Factors   prolong walking    Pain Relieving Factors  at rest and meds         [facility] PT Assessment - 10/23/19 0001      AROM   AROM Assessment Site  Ankle    Right/Left Ankle  Right    Right Ankle Dorsiflexion  3                   [facility] Adult PT Treatment/Exercise - 10/23/19 0001      Manual Therapy   Manual Therapy  Soft tissue mobilization;Passive ROM    Manual therapy comments  gentle manual STW to  medial knee to reduce sorenss     Soft tissue mobilization  STW to calf and medial ankle with DF gentle stretches and gentlecalf stretch    Passive ROM  gentle PROM into DF to improve range.      Ankle Exercises: Aerobic   Nustep  L5 x10 mins      Ankle Exercises: Seated   Other Seated Ankle Exercises  Standing  Rockerboard ant/post x3 min, Dynadisc 2x10 DF/PF, CW,CCW       Ankle Exercises: Standing   Other Standing Ankle Exercises  R forward step down 4"  x 10reps      Ankle Exercises: Supine   T-Band  yellow t-band x10 each                  PT Long Term Goals - 10/23/19 1121      PT LONG TERM GOAL #1   Title  Patient will be independent with HEP    Time  6    Period  Weeks    Status  On-going      PT LONG TERM GOAL #  2   Title  Patient will demonstrate 6+ degrees of right ankle DF AROM to improve gait mechanics.    Time  6    Period  Weeks    Status  On-going   AROM 3 degrees DF 10/23/19     PT LONG TERM GOAL #3   Title  Patient will report ability to walk community distances with minimal gait deviations and right ankle pain less than or equal to 4/10.    Time  6    Period  Weeks    Status  On-going      PT LONG TERM GOAL #4   Title  Patient will demonstrate 4+/5 or greater of right ankle MMT to improve stability during functional tasks.    Time  6    Period  Weeks    Status  On-going   NT 10/23/19     PT LONG TERM GOAL #5   Title  Patient will report ability to perform ADLs and work activities with pain less than or equal to 4/10.    Time  6    Period  Weeks    Status  On-going            Plan - 10/23/19 1117    Clinical Impression Statement  Patient tolerated treatment well today. Patient has improved AROM in ankle and has less pain overall. Patient feels 50% improvement and is able to perform ADL's with greater ease. Patient had some palpable soreness medial left ankle and tightness in calf today. Patient requested no modalities due to an  appt she needed to get to and she would ice at home. Patient goals progressing this week.    Personal Factors and Comorbidities  Age    Examination-Activity Limitations  Locomotion Level;Transfers;Squat;Stairs;Stand    Stability/Clinical Decision Making  Stable/Uncomplicated    Rehab Potential  Good    PT Frequency  2x / week    PT Duration  6 weeks    PT Treatment/Interventions  ADLs/Self Care Home Management;Iontophoresis 4mg /ml Dexamethasone;Moist Heat;Ultrasound;Cryotherapy; ;Therapeutic activities;Functional mobility training;Therapeutic exercise;Balance training;Neuromuscular re-education;Patient/family education;Passive range of motion;Vasopneumatic Device;Taping;Manual techniques    PT Next Visit Plan  cont Nustep, right ankle AROM, PROM, gentle strengthening, modalities PRN for pain relief    Consulted and Agree with Plan of Care  Patient       Patient will benefit from skilled therapeutic intervention in order to improve the following deficits and impairments:  Abnormal gait, Pain, Decreased activity tolerance, Decreased balance, Increased edema, Decreased strength, Difficulty walking, Decreased range of motion  Visit Diagnosis: Stiffness of right ankle, not elsewhere classified  Pain in right ankle and joints of right foot  Difficulty in walking, not elsewhere classified     Problem List There are no problems to display for this patient.   Retail banker, PTA 10/23/2019, 11:25 AM  [facility] [address] [place], [place], [place] Phone: [phone]   Fax:  [phone]  Name: Tiffany Mcintosh MRN: [name] Date of Birth: Mar 01, 1966

## 2019-10-27 ENCOUNTER — Ambulatory Visit: Payer: BC Managed Care – PPO | Admitting: Physical Therapy

## 2019-10-27 ENCOUNTER — Other Ambulatory Visit: Payer: Self-pay

## 2019-10-27 ENCOUNTER — Encounter: Payer: Self-pay | Admitting: Physical Therapy

## 2019-10-27 DIAGNOSIS — M25671 Stiffness of right ankle, not elsewhere classified: Secondary | ICD-10-CM

## 2019-10-27 DIAGNOSIS — M25571 Pain in right ankle and joints of right foot: Secondary | ICD-10-CM

## 2019-10-27 DIAGNOSIS — R262 Difficulty in walking, not elsewhere classified: Secondary | ICD-10-CM

## 2019-10-27 NOTE — Therapy (Signed)
[facility] [place], [place], [address] Phone: [phone]   Fax:  [phone]  Physical Therapy Treatment  Patient Details  Name: Tiffany Mcintosh MRN: [id number] Date of Birth: 09-Dec-1965 Referring Provider (PT): Wylene Simmer, MD   Encounter Date: 10/27/2019  PT End of Session - 10/27/19 1356    Visit Number  8    Number of Visits  12    Date for PT Re-Evaluation  11/13/19    Authorization Type  FOTO; progress note every 10th visit    PT Start Time  1331    PT Stop Time  1416    PT Time Calculation (min)  45 min    Activity Tolerance  Patient tolerated treatment well    Behavior During Therapy  [facility] for tasks assessed/performed       History reviewed. No pertinent past medical history.  History reviewed. No pertinent surgical history.  There were no vitals filed for this visit.  Subjective Assessment - 10/27/19 1333    Subjective  COVID 19 screening performed on patient upon arrival. Patient reports 70% improvment although limited with uneven surfaces. Patient reports that when she tries to walk normally R medial knee pain begins.    Limitations  Standing;Walking;House hold activities    Diagnostic tests  x-ray: severe brusing of ankle; MRI: ligaments swollen per patient report    Patient Stated Goals  decrease pain, improve movement    Currently in Pain?  No/denies         [facility] PT Assessment - 10/27/19 0001      Assessment   Medical Diagnosis  Pain of right ankle joint    Referring Provider (PT)  Wylene Simmer, MD    Next MD Visit  11/03/2019    Prior Therapy  no      Precautions   Precautions  None      Restrictions   Weight Bearing Restrictions  No                   [facility] Adult PT Treatment/Exercise - 10/27/19 0001      Modalities   Modalities  Electrical Stimulation;Vasopneumatic      Electrical Stimulation   Electrical Stimulation Location  R ankle    Electrical Stimulation Action   IFC    Electrical Stimulation Parameters  80-150 hz x15 min    Electrical Stimulation Goals  Edema;Pain      Vasopneumatic   Number Minutes Vasopneumatic   15 minutes    Vasopnuematic Location   Ankle    Vasopneumatic Pressure  Medium    Vasopneumatic Temperature   34      Ankle Exercises: Aerobic   Stationary Bike  L3 x10 min      Ankle Exercises: Seated   ABC's  1 rep   ankle isolator 0.5#   BAPS  Sitting;Level 2;15 reps   PF/DF, Inv/EV, CW and CCW circles   Other Seated Ankle Exercises  R ankle inversion/eversion 0.5# x20 reps      Ankle Exercises: Standing   Rocker Board  2 minutes    Heel Raises  Both;20 reps    Toe Raise  20 reps                  PT Long Term Goals - 10/23/19 1121      PT LONG TERM GOAL #1   Title  Patient will be independent with HEP    Time  6    Period  Weeks  Status  On-going      PT LONG TERM GOAL #2   Title  Patient will demonstrate 6+ degrees of right ankle DF AROM to improve gait mechanics.    Time  6    Period  Weeks    Status  On-going   AROM 3 degrees DF 10/23/19     PT LONG TERM GOAL #3   Title  Patient will report ability to walk community distances with minimal gait deviations and right ankle pain less than or equal to 4/10.    Time  6    Period  Weeks    Status  On-going      PT LONG TERM GOAL #4   Title  Patient will demonstrate 4+/5 or greater of right ankle MMT to improve stability during functional tasks.    Time  6    Period  Weeks    Status  On-going   NT 10/23/19     PT LONG TERM GOAL #5   Title  Patient will report ability to perform ADLs and work activities with pain less than or equal to 4/10.    Time  6    Period  Weeks    Status  On-going            Plan - 10/27/19 1429    Clinical Impression Statement  Patient presents in clinic with overall improvement with R ankle although still limited with uneven surfaces as well as descending stairs. Patient able to tolerate strengthening  exercises well today and eccentric control intiated with BAPS board. Patient still limited with some shoes due to swelling in R ankle and foot. No palpable tenderness to R ankle and forefoot upon assessment. Normal modalties response noted following removal of the modalities.    Personal Factors and Comorbidities  Age    Examination-Activity Limitations  Locomotion Level;Transfers;Squat;Stairs;Stand    Stability/Clinical Decision Making  Stable/Uncomplicated    Rehab Potential  Good    PT Frequency  2x / week    PT Duration  6 weeks    PT Treatment/Interventions  ADLs/Self Care Home Management;Iontophoresis 4mg /ml Dexamethasone;Moist Heat;Ultrasound;Cryotherapy; ;Therapeutic activities;Functional mobility training;Therapeutic exercise;Balance training;Neuromuscular re-education;Patient/family education;Passive range of motion;Vasopneumatic Device;Taping;Manual techniques    PT Next Visit Plan  MD note required next visit for MD appointment on 11/03/2019.    PT Home Exercise Plan  see patient education section.    Consulted and Agree with Plan of Care  Patient       Patient will benefit from skilled therapeutic intervention in order to improve the following deficits and impairments:  Abnormal gait, Pain, Decreased activity tolerance, Decreased balance, Increased edema, Decreased strength, Difficulty walking, Decreased range of motion  Visit Diagnosis: Stiffness of right ankle, not elsewhere classified  Pain in right ankle and joints of right foot  Difficulty in walking, not elsewhere classified     Problem List There are no problems to display for this patient.   11/05/2019, PTA 10/27/2019, 2:41 PM  [facility] [address] [place], [place], [place] Phone: [phone]   Fax:  [phone]  Name: Tiffany Mcintosh MRN: [name] Date of Birth: 1965/11/23

## 2019-10-30 ENCOUNTER — Ambulatory Visit: Payer: BC Managed Care – PPO | Admitting: Physical Therapy

## 2019-10-30 ENCOUNTER — Encounter: Payer: Self-pay | Admitting: Physical Therapy

## 2019-10-30 ENCOUNTER — Other Ambulatory Visit: Payer: Self-pay

## 2019-10-30 DIAGNOSIS — R262 Difficulty in walking, not elsewhere classified: Secondary | ICD-10-CM

## 2019-10-30 DIAGNOSIS — M25671 Stiffness of right ankle, not elsewhere classified: Secondary | ICD-10-CM

## 2019-10-30 DIAGNOSIS — M25571 Pain in right ankle and joints of right foot: Secondary | ICD-10-CM

## 2019-10-30 NOTE — Therapy (Signed)
[facility] [place], [place], [address] Phone: [phone]   Fax:  [phone]  Physical Therapy Treatment  Patient Details  Name: Tiffany Mcintosh MRN: [id number] Date of Birth: 01/11/1966 Referring Provider (PT): Wylene Simmer, MD   Encounter Date: 10/30/2019  PT End of Session - 10/30/19 1139    Visit Number  9    Number of Visits  12    Date for PT Re-Evaluation  11/13/19    Authorization Type  FOTO; progress note every 10th visit    PT Start Time  1118    PT Stop Time  1207    PT Time Calculation (min)  49 min    Activity Tolerance  Patient tolerated treatment well    Behavior During Therapy  [facility] for tasks assessed/performed       History reviewed. No pertinent past medical history.  History reviewed. No pertinent surgical history.  There were no vitals filed for this visit.  Subjective Assessment - 10/30/19 1119    Subjective  COVID 19 screening performed on patient upon arrival. Reports increased soreness following last treatment.    Limitations  Standing;Walking;House hold activities    Diagnostic tests  x-ray: severe brusing of ankle; MRI: ligaments swollen per patient report    Patient Stated Goals  decrease pain, improve movement    Currently in Pain?  Other (Comment)   No pain assessment provided today by patient        [facility] PT Assessment - 10/30/19 0001      Assessment   Medical Diagnosis  Pain of right ankle joint    Referring Provider (PT)  Wylene Simmer, MD    Next MD Visit  11/03/2019    Prior Therapy  no      Precautions   Precautions  None      Restrictions   Weight Bearing Restrictions  No      Observation/Other Assessments   Focus on Therapeutic Outcomes (FOTO)   34% limitation, CJ      ROM / Strength   AROM / PROM / Strength  AROM;Strength      AROM   Overall AROM   Deficits    AROM Assessment Site  Ankle    Right/Left Ankle  Right    Right Ankle Dorsiflexion  4       Strength   Overall Strength  Due to pain;Within functional limits for tasks performed    Overall Strength Comments  PF, eversion limited by 4-5th metarsal pain    Strength Assessment Site  Ankle    Right/Left Ankle  Right    Right Ankle Dorsiflexion  4+/5    Right Ankle Plantar Flexion  4+/5    Right Ankle Inversion  4+/5                   [facility] Adult PT Treatment/Exercise - 10/30/19 0001      Modalities   Modalities  Electrical Stimulation;Vasopneumatic      Electrical Stimulation   Electrical Stimulation Location  R ankle     Electrical Stimulation Action  IFC    Electrical Stimulation Parameters  80-150 hz x15 min    Electrical Stimulation Goals  Edema;Pain      Vasopneumatic   Number Minutes Vasopneumatic   15 minutes    Vasopnuematic Location   Ankle    Vasopneumatic Pressure  Medium    Vasopneumatic Temperature   34      Ankle Exercises: Aerobic   Nustep  L4 x10 min      Ankle Exercises: Standing   SLS  instability and limited by "uncomfortable" sensation in RLE SLS x1 rep    Rocker Board  3 minutes    Heel Raises  Both;20 reps    Toe Raise  20 reps      Ankle Exercises: Seated   ABC's  1 rep   0.5# ankle isolator   Other Seated Ankle Exercises  R ankle inversion/eversion 0.5# x20 reps                  PT Long Term Goals - 10/30/19 1121      PT LONG TERM GOAL #1   Title  Patient will be independent with HEP    Time  6    Period  Weeks    Status  Achieved      PT LONG TERM GOAL #2   Title  Patient will demonstrate 6+ degrees of right ankle DF AROM to improve gait mechanics.    Time  6    Period  Weeks    Status  On-going   AROM 3 degrees DF 10/23/19     PT LONG TERM GOAL #3   Title  Patient will report ability to walk community distances with minimal gait deviations and right ankle pain less than or equal to 4/10.    Time  6    Period  Weeks    Status  Achieved   Limited to 30-45 minutes per patient report due to fatigue and  discomfort     PT LONG TERM GOAL #4   Title  Patient will demonstrate 4+/5 or greater of right ankle MMT to improve stability during functional tasks.    Time  6    Period  Weeks    Status  On-going   NT 10/23/19     PT LONG TERM GOAL #5   Title  Patient will report ability to perform ADLs and work activities with pain less than or equal to 4/10.    Time  6    Period  Weeks    Status  Achieved            Plan - 10/30/19 1153    Clinical Impression Statement  Patient presented in clinic with reports of soreness in R ankle/foot secondary to new exercises in last treatment. Patient limited functionally with descending stairs due to ROM and walking at normal cadence with others only. Patient reports that the faster she tries to walk the more gait deviations are present. Deficient proprioception noted in RLE SLS on floor with patient reporting medial and lateral ankle discomfort. AROM R ankle DF measured as 4 deg with full knee extension. PF and eversion MMT assessment limited by 4-5th metatarsal pain. Only minimal edema noted around R ankle and foot at this time with most edema noted posterior to lateral malleoli. Normal modalities response noted following removal of the modalities.    Personal Factors and Comorbidities  Age    Examination-Activity Limitations  Locomotion Level;Transfers;Squat;Stairs;Stand    Stability/Clinical Decision Making  Stable/Uncomplicated    Rehab Potential  Good    PT Frequency  2x / week    PT Duration  6 weeks    PT Treatment/Interventions  ADLs/Self Care Home Management;Iontophoresis 4mg /ml Dexamethasone;Moist Heat;Ultrasound;Cryotherapy; ;Therapeutic activities;Functional mobility training;Therapeutic exercise;Balance training;Neuromuscular re-education;Patient/family education;Passive range of motion;Vasopneumatic Device;Taping;Manual techniques    PT Next Visit Plan  Continue per MD discretion.     Consulted and Agree  with Plan of Care  Patient       Patient will benefit from skilled therapeutic intervention in order to improve the following deficits and impairments:  Abnormal gait, Pain, Decreased activity tolerance, Decreased balance, Increased edema, Decreased strength, Difficulty walking, Decreased range of motion  Visit Diagnosis: Stiffness of right ankle, not elsewhere classified  Pain in right ankle and joints of right foot  Difficulty in walking, not elsewhere classified     Problem List There are no problems to display for this patient.   Marvell FullerKelsey P Kalee Mcclenathan, PTA 10/30/19 12:16 PM   [facility] [address] [place], [place], [address] Phone: [phone]   Fax:  [phone]  Name: Tiffany Mcintosh MRN: [id number] Date of Birth: 1965-12-26

## 2019-11-03 ENCOUNTER — Ambulatory Visit: Payer: BC Managed Care – PPO | Admitting: Physical Therapy

## 2019-11-03 ENCOUNTER — Encounter: Payer: Self-pay | Admitting: Physical Therapy

## 2019-11-03 ENCOUNTER — Other Ambulatory Visit: Payer: Self-pay

## 2019-11-03 DIAGNOSIS — M25671 Stiffness of right ankle, not elsewhere classified: Secondary | ICD-10-CM | POA: Diagnosis not present

## 2019-11-03 DIAGNOSIS — M25571 Pain in right ankle and joints of right foot: Secondary | ICD-10-CM

## 2019-11-03 DIAGNOSIS — R262 Difficulty in walking, not elsewhere classified: Secondary | ICD-10-CM

## 2019-11-03 NOTE — Therapy (Signed)
[facility] [address] [place], [place], [address] Phone: [phone]   Fax:  [phone]  Physical Therapy Treatment Progress Note Reporting Period 09/25/2019 to 11/04/2019  See note below for Objective Data and Assessment of Progress/Goals. Patient is responding well to therapy and goals are ongoing at this time.  Guss Bunde, PT, DPT    Patient Details  Name: Tiffany Mcintosh MRN: [id number] Date of Birth: [birth year] Referring Provider (PT): Toni Arthurs, MD   Encounter Date: 11/03/2019  PT End of Session - 11/03/19 0859    Visit Number  10    Number of Visits  12    Date for PT Re-Evaluation  11/13/19    Authorization Type  FOTO; progress note every 10th visit    PT Start Time  0900    PT Stop Time  0947    PT Time Calculation (min)  47 min    Activity Tolerance  Patient tolerated treatment well    Behavior During Therapy  [facility] for tasks assessed/performed       History reviewed. No pertinent past medical history.  History reviewed. No pertinent surgical history.  There were no vitals filed for this visit.  Subjective Assessment - 11/03/19 0859    Subjective  COVID 19 screening performed on patient upon arrival. Reports increased soreness following last treatment.    Limitations  Standing;Walking;House hold activities    Diagnostic tests  x-ray: severe brusing of ankle; MRI: ligaments swollen per patient report    Patient Stated Goals  decrease pain, improve movement    Currently in Pain?  Other (Comment)   No current pain assessment provided by patient        [facility] PT Assessment - 11/03/19 0001      Assessment   Medical Diagnosis  Pain of right ankle joint    Referring Provider (PT)  Toni Arthurs, MD    Next MD Visit  11/03/2019    Prior Therapy  no      Precautions   Precautions  None      Restrictions   Weight Bearing Restrictions  No                   [facility] Adult PT  Treatment/Exercise - 11/03/19 0001      Modalities   Modalities  Electrical Stimulation;Vasopneumatic      Electrical Stimulation   Electrical Stimulation Location  R ankle     Electrical Stimulation Action  IFC    Electrical Stimulation Parameters  80-150 hz x15 min    Electrical Stimulation Goals  Edema;Pain      Vasopneumatic   Number Minutes Vasopneumatic   15 minutes    Vasopnuematic Location   Ankle    Vasopneumatic Pressure  Medium    Vasopneumatic Temperature   34      Ankle Exercises: Aerobic   Nustep  L4 x10 min      Ankle Exercises: Standing   Rocker Board  3 minutes    Other Standing Ankle Exercises  Forward/lateral step ups 6" step x20 reps each      Ankle Exercises: Seated   ABC's  1 rep   0.5# ankle isolator   Toe Raise  20 reps;Other (comment)   0.5# ankle isolator   BAPS  Sitting;Level 2;15 reps    Other Seated Ankle Exercises  R ankle inversion/eversion 0.5# x20 reps                  PT Long Term  Goals - 10/30/19 1121      PT LONG TERM GOAL #1   Title  Patient will be independent with HEP    Time  6    Period  Weeks    Status  Achieved      PT LONG TERM GOAL #2   Title  Patient will demonstrate 6+ degrees of right ankle DF AROM to improve gait mechanics.    Time  6    Period  Weeks    Status  On-going   AROM 3 degrees DF 10/23/19     PT LONG TERM GOAL #3   Title  Patient will report ability to walk community distances with minimal gait deviations and right ankle pain less than or equal to 4/10.    Time  6    Period  Weeks    Status  Achieved   Limited to 30-45 minutes per patient report due to fatigue and discomfort     PT LONG TERM GOAL #4   Title  Patient will demonstrate 4+/5 or greater of right ankle MMT to improve stability during functional tasks.    Time  6    Period  Weeks    Status  On-going   NT 10/23/19     PT LONG TERM GOAL #5   Title  Patient will report ability to perform ADLs and work activities with pain less  than or equal to 4/10.    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 Patient able to complete therex fairly well although modified for more in sitting secondary to metatarsal pain. Good overal L ankle control noted with BAPS and ankle isolator. Minimal R ankle/forefoot edema present upon observation. Normal modalities response noted following removal of the modalities.    Personal Factors and Comorbidities  Age    Examination-Activity Limitations  Locomotion Level;Transfers;Squat;Stairs;Stand    Stability/Clinical Decision Making  Stable/Uncomplicated    Rehab Potential  Good    PT Frequency  2x / week    PT Duration  6 weeks    PT Treatment/Interventions  ADLs/Self Care Home Management;Iontophoresis 4mg /ml Dexamethasone;Moist Heat;Ultrasound;Cryotherapy;Occupational psychologist;Therapeutic activities;Functional mobility training;Therapeutic exercise;Balance training;Neuromuscular re-education;Patient/family education;Passive range of motion;Vasopneumatic Device;Taping;Manual techniques    PT Next Visit Plan  Continue per MD discretion.    PT Home Exercise Plan  see patient education section.    Consulted and Agree with Plan of Care  Patient       Patient will benefit from skilled therapeutic intervention in order to improve the following deficits and impairments:  Abnormal gait, Pain, Decreased activity tolerance, Decreased balance, Increased edema, Decreased strength, Difficulty walking, Decreased range of motion  Visit Diagnosis: Stiffness of right ankle, not elsewhere classified  Pain in right ankle and joints of right foot  Difficulty in walking, not elsewhere classified     Problem List There are no problems to display for this patient.   Standley Brooking, PTA 11/03/2019, 10:03  AM  [facility] [address] [place], [place], [address] Phone: [phone]   Fax:  [phone]  Name: Tiffany Mcintosh MRN: [id number] Date of Birth: 09-20-1966

## 2019-11-04 ENCOUNTER — Encounter: Payer: Self-pay | Admitting: Physical Therapy

## 2019-11-06 ENCOUNTER — Other Ambulatory Visit: Payer: Self-pay

## 2019-11-06 ENCOUNTER — Ambulatory Visit: Payer: BC Managed Care – PPO | Admitting: Physical Therapy

## 2019-11-06 ENCOUNTER — Encounter: Payer: Self-pay | Admitting: Physical Therapy

## 2019-11-06 DIAGNOSIS — M25671 Stiffness of right ankle, not elsewhere classified: Secondary | ICD-10-CM | POA: Diagnosis not present

## 2019-11-06 DIAGNOSIS — M25571 Pain in right ankle and joints of right foot: Secondary | ICD-10-CM

## 2019-11-06 DIAGNOSIS — R262 Difficulty in walking, not elsewhere classified: Secondary | ICD-10-CM

## 2019-11-06 NOTE — Therapy (Signed)
[facility] [place], [place], [address] Phone: [phone]   Fax:  [phone]  Physical Therapy Treatment  Patient Details  Name: Tiffany Mcintosh MRN: [id number] Date of Birth: 1966-02-18 Referring Provider (PT): Wylene Simmer, MD   Encounter Date: 11/06/2019  PT End of Session - 11/06/19 1033    Visit Number  11    Number of Visits  12    Date for PT Re-Evaluation  11/13/19    Authorization Type  FOTO; progress note every 10th visit    PT Start Time  1020    PT Stop Time  1119    PT Time Calculation (min)  59 min    Activity Tolerance  Patient tolerated treatment well    Behavior During Therapy  [facility] for tasks assessed/performed       History reviewed. No pertinent past medical history.  History reviewed. No pertinent surgical history.  There were no vitals filed for this visit.  Subjective Assessment - 11/06/19 1031    Subjective  COVID 19 screening performed on patient upon arrival. Reports that MD wants her to improve DF and to improve gait to avoid other issues.    Limitations  Standing;Walking;House hold activities    Diagnostic tests  x-ray: severe brusing of ankle; MRI: ligaments swollen per patient report    Patient Stated Goals  decrease pain, improve movement    Currently in Pain?  Yes    Pain Score  2     Pain Location  Ankle    Pain Orientation  Right    Pain Descriptors / Indicators  Aching    Pain Type  Chronic pain    Pain Onset  More than a month ago    Pain Frequency  Constant         [facility] PT Assessment - 11/06/19 0001      Assessment   Medical Diagnosis  Pain of right ankle joint    Referring Provider (PT)  Wylene Simmer, MD    Next MD Visit  12/15/2019 or none    Prior Therapy  no      Precautions   Precautions  None      Restrictions   Weight Bearing Restrictions  No      Palpation   Palpation comment  Tender to palpation across metatarsal heads/ MTP joints                    [facility] Adult PT Treatment/Exercise - 11/06/19 0001      Ambulation/Gait   Ambulation/Gait  Yes    Ambulation/Gait Assistance  6: Modified independent (Device/Increase time)    Ambulation Distance (Feet)  100 Feet    Assistive device  None    Gait Pattern  Decreased stance time - right;Decreased stride length;Decreased weight shift to right;Antalgic    Ambulation Surface  Level;Indoor    Gait Comments  Greatest pain reported at toe off which results in antalgic gait; increased speed leads to increased deviation      Modalities   Modalities  Psychologist, educational Location  R midfoot/ankle    Electrical Stimulation Action  IFC    Electrical Stimulation Parameters  80-150 hz x15 min    Electrical Stimulation Goals  Edema;Pain      Vasopneumatic   Number Minutes Vasopneumatic   15 minutes    Vasopnuematic Location   Ankle    Vasopneumatic Pressure  Medium  Vasopneumatic Temperature   34      Ankle Exercises: Aerobic   Nustep  L4 x10 min LEs only      Ankle Exercises: Standing   Rocker Board  3 minutes   constant gastroc stretch     Ankle Exercises: Seated   ABC's  1 rep   0.5# ankle isolator   Ankle Circles/Pumps  Strengthening;Right;20 reps   0.5# ankle isolator   Towel Crunch  Other (comment)   to towel, fluffy pillow   Marble Pickup  x3 reps    Heel Raises  Both;20 reps    Toe Raise  20 reps    Other Seated Ankle Exercises  R ankle inversion/eversion 0.5# x20 reps                  PT Long Term Goals - 10/30/19 1121      PT LONG TERM GOAL #1   Title  Patient will be independent with HEP    Time  6    Period  Weeks    Status  Achieved      PT LONG TERM GOAL #2   Title  Patient will demonstrate 6+ degrees of right ankle DF AROM to improve gait mechanics.    Time  6    Period  Weeks    Status  On-going   AROM 3 degrees DF 10/23/19     PT LONG TERM GOAL  #3   Title  Patient will report ability to walk community distances with minimal gait deviations and right ankle pain less than or equal to 4/10.    Time  6    Period  Weeks    Status  Achieved   Limited to 30-45 minutes per patient report due to fatigue and discomfort     PT LONG TERM GOAL #4   Title  Patient will demonstrate 4+/5 or greater of right ankle MMT to improve stability during functional tasks.    Time  6    Period  Weeks    Status  On-going   NT 10/23/19     PT LONG TERM GOAL #5   Title  Patient will report ability to perform ADLs and work activities with pain less than or equal to 4/10.    Time  6    Period  Weeks    Status  Achieved            Plan - 11/06/19 1125    Clinical Impression Statement  Patient presented in clinic with reports of MD requesting more gait training and strengthening. Patient's greatest limitation is gait with greatest pain being at toe off. Palpable metatarsal tenderness palpable across metatarsal heads. Greatest focus today on ankle strengthening as well as intrinsic foot strengthening. Edema observed over dorsal 4-5th metatarsals and also posterior to R lateral malleoli. Patient encouraged to complete towel crunches at home as well as more icing and elevating of R foot to alleviate metatarsal symptoms. Normal modalities response noted following removal of the modalities.    Personal Factors and Comorbidities  Age    Examination-Activity Limitations  Locomotion Level;Transfers;Squat;Stairs;Stand    Stability/Clinical Decision Making  Stable/Uncomplicated    Rehab Potential  Good    PT Frequency  2x / week    PT Duration  6 weeks    PT Treatment/Interventions  ADLs/Self Care Home Management;Iontophoresis 4mg /ml Dexamethasone;Moist Heat;Ultrasound;Cryotherapy; ;Therapeutic activities;Functional mobility training;Therapeutic exercise;Balance training;Neuromuscular re-education;Patient/family  education;Passive range of motion;Vasopneumatic Device;Taping;Manual techniques    PT Next Visit  Plan  Continue with focus on ankle and intrinsic foot strengthening.    PT Home Exercise Plan  see patient education section.    Consulted and Agree with Plan of Care  Patient       Patient will benefit from skilled therapeutic intervention in order to improve the following deficits and impairments:  Abnormal gait, Pain, Decreased activity tolerance, Decreased balance, Increased edema, Decreased strength, Difficulty walking, Decreased range of motion  Visit Diagnosis: Stiffness of right ankle, not elsewhere classified  Pain in right ankle and joints of right foot  Difficulty in walking, not elsewhere classified     Problem List There are no problems to display for this patient.   Marvell FullerKelsey P Kaizer Dissinger, PTA 11/06/2019, 11:30 AM  [facility] [address] [place], [place], [address] Phone: [phone]   Fax:  [phone]  Name: Tiffany Mcintosh MRN: [id number] Date of Birth: 04/11/1966

## 2019-11-09 DIAGNOSIS — N3000 Acute cystitis without hematuria: Secondary | ICD-10-CM | POA: Diagnosis not present

## 2019-11-09 DIAGNOSIS — R300 Dysuria: Secondary | ICD-10-CM | POA: Diagnosis not present

## 2019-11-09 DIAGNOSIS — Z6829 Body mass index (BMI) 29.0-29.9, adult: Secondary | ICD-10-CM | POA: Diagnosis not present

## 2019-11-10 ENCOUNTER — Ambulatory Visit: Payer: BC Managed Care – PPO | Admitting: Physical Therapy

## 2019-11-11 ENCOUNTER — Ambulatory Visit: Payer: BC Managed Care – PPO | Admitting: Physical Therapy

## 2019-11-11 ENCOUNTER — Encounter: Payer: Self-pay | Admitting: Physical Therapy

## 2019-11-11 ENCOUNTER — Other Ambulatory Visit: Payer: Self-pay

## 2019-11-11 DIAGNOSIS — M25571 Pain in right ankle and joints of right foot: Secondary | ICD-10-CM

## 2019-11-11 DIAGNOSIS — M25671 Stiffness of right ankle, not elsewhere classified: Secondary | ICD-10-CM

## 2019-11-11 DIAGNOSIS — R262 Difficulty in walking, not elsewhere classified: Secondary | ICD-10-CM

## 2019-11-11 NOTE — Therapy (Signed)
[facility] [address] [place], [place], [address] Phone: [phone]   Fax:  [phone]  Physical Therapy Treatment  Patient Details  Name: Tiffany Mcintosh MRN: [id number] Date of Birth: 12/10/1965 Referring Provider (PT): Toni ArthursJohn Hewitt, MD   Encounter Date: 11/11/2019  PT End of Session - 11/11/19 1037    Visit Number  12    Number of Visits  18    Date for PT Re-Evaluation  12/12/19    Authorization Type  FOTO; progress note every 10th visit    PT Start Time  1031    PT Stop Time  1118    PT Time Calculation (min)  47 min    Activity Tolerance  Patient tolerated treatment well    Behavior During Therapy  [facility] for tasks assessed/performed       History reviewed. No pertinent past medical history.  History reviewed. No pertinent surgical history.  There were no vitals filed for this visit.  Subjective Assessment - 11/11/19 1036    Subjective  COVID 19 screening performed on patient upon arrival. Reports that she tried ascending stairs without her whole foot on the step and her metatarsals were very painful and swollen following.    Limitations  Standing;Walking;House hold activities    Diagnostic tests  x-ray: severe brusing of ankle; MRI: ligaments swollen per patient report    Patient Stated Goals  decrease pain, improve movement    Currently in Pain?  Yes    Pain Score  2     Pain Location  Ankle    Pain Orientation  Right    Pain Descriptors / Indicators  Discomfort    Pain Type  Chronic pain    Pain Onset  More than a month ago    Pain Frequency  Constant         [facility] PT Assessment - 11/11/19 0001      Assessment   Medical Diagnosis  Pain of right ankle joint    Referring Provider (PT)  Toni ArthursJohn Hewitt, MD    Next MD Visit  12/15/2019 or none    Prior Therapy  no      Precautions   Precautions  None      Restrictions   Weight Bearing Restrictions  No                   [facility] Adult PT  Treatment/Exercise - 11/11/19 0001      Modalities   Modalities  Electrical Stimulation;Vasopneumatic      Electrical Stimulation   Electrical Stimulation Location  R lateral foot/ankle    Electrical Stimulation Action  Pre-Mod    Electrical Stimulation Parameters  80-150 hz x10 min    Electrical Stimulation Goals  Edema;Pain      Vasopneumatic   Number Minutes Vasopneumatic   10 minutes    Vasopnuematic Location   Ankle    Vasopneumatic Pressure  Medium    Vasopneumatic Temperature   34      Ankle Exercises: Aerobic   Nustep  L4 x10 min LEs only      Ankle Exercises: Standing   Rocker Board  3 minutes   prolonged gastroc stretch     Ankle Exercises: Seated   ABC's  1 rep   1# ankle isolator   Towel Crunch  Other (comment)   x1 min pillowcase, x1 min handtowel, reps to plastic tubing   Marble Pickup  x2 reps    Heel Raises  Both;20 reps  3D   Other Seated Ankle Exercises  R ankle inversion/eversion/DF 1# x30 reps                  PT Long Term Goals - 10/30/19 1121      PT LONG TERM GOAL #1   Title  Patient will be independent with HEP    Time  6    Period  Weeks    Status  Achieved      PT LONG TERM GOAL #2   Title  Patient will demonstrate 6+ degrees of right ankle DF AROM to improve gait mechanics.    Time  6    Period  Weeks    Status  On-going   AROM 3 degrees DF 10/23/19     PT LONG TERM GOAL #3   Title  Patient will report ability to walk community distances with minimal gait deviations and right ankle pain less than or equal to 4/10.    Time  6    Period  Weeks    Status  Achieved   Limited to 30-45 minutes per patient report due to fatigue and discomfort     PT LONG TERM GOAL #4   Title  Patient will demonstrate 4+/5 or greater of right ankle MMT to improve stability during functional tasks.    Time  6    Period  Weeks    Status  On-going   NT 10/23/19     PT LONG TERM GOAL #5   Title  Patient will report ability to perform ADLs  and work activities with pain less than or equal to 4/10.    Time  6    Period  Weeks    Status  Achieved            Plan - 11/11/19 1113    Clinical Impression Statement  Patient presented in clinic with continued reports of R 4-5th metatarsal pain. Patient more mindful of her gait sequence as to improve gait. Increased speed of gait increased compensatory strategies and antalgic gait per patient report. Edema continues to be observed over the 4-5th metatarsals. Increased pain reported with resisted toe crunch to plastic tubing. Normal modalities response noted following removal of the modalities.    Personal Factors and Comorbidities  Age    Examination-Activity Limitations  Locomotion Level;Transfers;Squat;Stairs;Stand    Stability/Clinical Decision Making  Stable/Uncomplicated    Rehab Potential  Good    PT Frequency  2x / week    PT Duration  6 weeks    PT Treatment/Interventions  ADLs/Self Care Home Management;Iontophoresis 4mg /ml Dexamethasone;Moist Heat;Ultrasound;Cryotherapy;Occupational psychologist;Therapeutic activities;Functional mobility training;Therapeutic exercise;Balance training;Neuromuscular re-education;Patient/family education;Passive range of motion;Vasopneumatic Device;Taping;Manual techniques    PT Next Visit Plan  Continue with focus on ankle and intrinsic foot strengthening.    PT Home Exercise Plan  see patient education section.    Consulted and Agree with Plan of Care  Patient       Patient will benefit from skilled therapeutic intervention in order to improve the following deficits and impairments:  Abnormal gait, Pain, Decreased activity tolerance, Decreased balance, Increased edema, Decreased strength, Difficulty walking, Decreased range of motion  Visit Diagnosis: Stiffness of right ankle, not elsewhere classified  Pain in right ankle and joints of right foot  Difficulty in walking, not elsewhere classified     Problem  List There are no problems to display for this patient.   Standley Brooking, PTA 11/11/19 3:14 PM   [facility] [address] [place], [place], [address] Phone: [phone]   Fax:  [phone]  Name: Tiffany Mcintosh MRN: [id number] Date of Birth: 11-29-1965

## 2019-11-13 ENCOUNTER — Encounter: Payer: BC Managed Care – PPO | Admitting: Physical Therapy

## 2019-11-13 DIAGNOSIS — N3010 Interstitial cystitis (chronic) without hematuria: Secondary | ICD-10-CM | POA: Diagnosis not present

## 2019-11-13 DIAGNOSIS — R300 Dysuria: Secondary | ICD-10-CM | POA: Diagnosis not present

## 2019-11-13 DIAGNOSIS — R102 Pelvic and perineal pain: Secondary | ICD-10-CM | POA: Diagnosis not present

## 2019-11-17 ENCOUNTER — Other Ambulatory Visit: Payer: Self-pay

## 2019-11-17 ENCOUNTER — Ambulatory Visit: Payer: BC Managed Care – PPO | Attending: Orthopedic Surgery | Admitting: Physical Therapy

## 2019-11-17 ENCOUNTER — Encounter: Payer: Self-pay | Admitting: Physical Therapy

## 2019-11-17 DIAGNOSIS — R262 Difficulty in walking, not elsewhere classified: Secondary | ICD-10-CM

## 2019-11-17 DIAGNOSIS — M25571 Pain in right ankle and joints of right foot: Secondary | ICD-10-CM | POA: Insufficient documentation

## 2019-11-17 DIAGNOSIS — M25671 Stiffness of right ankle, not elsewhere classified: Secondary | ICD-10-CM | POA: Diagnosis not present

## 2019-11-17 NOTE — Therapy (Signed)
[facility] [place], [place], [address] Phone: [phone]   Fax:  [phone]  Physical Therapy Treatment  Patient Details  Name: Tiffany Mcintosh MRN: [id number] Date of Birth: 04/15/1966 Referring Provider (PT): Wylene Simmer, MD   Encounter Date: 11/17/2019  PT End of Session - 11/17/19 1312    Visit Number  13    Number of Visits  18    Date for PT Re-Evaluation  12/12/19    Authorization Type  FOTO; progress note every 10th visit    PT Start Time  1301    PT Stop Time  1349    PT Time Calculation (min)  48 min    Activity Tolerance  Patient tolerated treatment well    Behavior During Therapy  [facility] for tasks assessed/performed       History reviewed. No pertinent past medical history.  History reviewed. No pertinent surgical history.  There were no vitals filed for this visit.  Subjective Assessment - 11/17/19 1311    Subjective  COVID 19 screening performed on patient upon arrival. Patient reports no pain in ankle or toes over the weekend. Reports still some swelling.    Limitations  Standing;Walking;House hold activities    Diagnostic tests  x-ray: severe brusing of ankle; MRI: ligaments swollen per patient report    Patient Stated Goals  decrease pain, improve movement    Currently in Pain?  No/denies         [facility] PT Assessment - 11/17/19 0001      Assessment   Medical Diagnosis  Pain of right ankle joint    Referring Provider (PT)  Wylene Simmer, MD    Next MD Visit  12/15/2019 or none    Prior Therapy  no      Precautions   Precautions  None      Restrictions   Weight Bearing Restrictions  No                   [facility] Adult PT Treatment/Exercise - 11/17/19 0001      Modalities   Modalities  Electrical Stimulation;Vasopneumatic      Electrical Stimulation   Electrical Stimulation Location  R lateral foot/ankle    Electrical Stimulation Action  IFC    Electrical Stimulation Parameters   80-150 hz x15 min    Electrical Stimulation Goals  Edema      Vasopneumatic   Number Minutes Vasopneumatic   15 minutes    Vasopnuematic Location   Ankle    Vasopneumatic Pressure  Medium    Vasopneumatic Temperature   34      Ankle Exercises: Aerobic   Nustep  L5 x10 min LEs only      Ankle Exercises: Standing   Rocker Board  3 minutes   prolonged gastroc stretch     Ankle Exercises: Seated   ABC's  1 rep   1# ankle isolator   Ankle Circles/Pumps  Strengthening;Right;Other reps (comment)   x30 reps 1# ankle isolator; CW and CCW   Ankle Circles/Pumps Limitations       Towel Crunch  Limitations   x2 min towel, x2 min pillowcase, x15 reps red theraband resi   Heel Raises  Both;20 reps    Toe Raise  20 reps    Other Seated Ankle Exercises  3D R ankle isolator 1# x20 reps each                  PT Long Term Goals - 10/30/19  1121      PT LONG TERM GOAL #1   Title  Patient will be independent with HEP    Time  6    Period  Weeks    Status  Achieved      PT LONG TERM GOAL #2   Title  Patient will demonstrate 6+ degrees of right ankle DF AROM to improve gait mechanics.    Time  6    Period  Weeks    Status  On-going   AROM 3 degrees DF 10/23/19     PT LONG TERM GOAL #3   Title  Patient will report ability to walk community distances with minimal gait deviations and right ankle pain less than or equal to 4/10.    Time  6    Period  Weeks    Status  Achieved   Limited to 30-45 minutes per patient report due to fatigue and discomfort     PT LONG TERM GOAL #4   Title  Patient will demonstrate 4+/5 or greater of right ankle MMT to improve stability during functional tasks.    Time  6    Period  Weeks    Status  On-going   NT 10/23/19     PT LONG TERM GOAL #5   Title  Patient will report ability to perform ADLs and work activities with pain less than or equal to 4/10.    Time  6    Period  Weeks    Status  Achieved            Plan - 11/17/19 1407     Clinical Impression Statement  Patient presented in clinic with reports of overall improvement since the weekend in regards to pain. No complaints of pain in metatarsals during ambulation and able to walk somewhat faster without compensation per patient. Patient able to complete more instrinsic foot strengthening without complaint of pain. Patient still tender to palpation of 4-5th metatarsal heads along plantar and lateral sides. Minimal edema present over dorsal aspect of 4-5th metatarsals and posterior to lateral malleolli. Normal modalities response noted following removal of the modalities.    Personal Factors and Comorbidities  Age    Examination-Activity Limitations  Locomotion Level;Transfers;Squat;Stairs;Stand    Stability/Clinical Decision Making  Stable/Uncomplicated    Rehab Potential  Good    PT Frequency  2x / week    PT Duration  6 weeks    PT Treatment/Interventions  ADLs/Self Care Home Management;Iontophoresis 4mg /ml Dexamethasone;Moist Heat;Ultrasound;Cryotherapy; ;Therapeutic activities;Functional mobility training;Therapeutic exercise;Balance training;Neuromuscular re-education;Patient/family education;Passive range of motion;Vasopneumatic Device;Taping;Manual techniques    PT Next Visit Plan  Continue with focus on ankle and intrinsic foot strengthening.    PT Home Exercise Plan  see patient education section.    Consulted and Agree with Plan of Care  Patient       Patient will benefit from skilled therapeutic intervention in order to improve the following deficits and impairments:  Abnormal gait, Pain, Decreased activity tolerance, Decreased balance, Increased edema, Decreased strength, Difficulty walking, Decreased range of motion  Visit Diagnosis: Stiffness of right ankle, not elsewhere classified  Pain in right ankle and joints of right foot  Difficulty in walking, not elsewhere classified     Problem List There  are no problems to display for this patient.   Retail banker, PTA 11/17/2019, 2:20 PM  [facility] [address] [place], [place], [place] Phone: [phone]   Fax:  [phone]  Name: Tiffany Mcintosh  MRN: [id number] Date of Birth: 18-Dec-1965

## 2019-11-20 ENCOUNTER — Encounter: Payer: BC Managed Care – PPO | Admitting: Physical Therapy

## 2019-11-25 ENCOUNTER — Ambulatory Visit: Payer: BC Managed Care – PPO | Admitting: Physical Therapy

## 2019-11-25 ENCOUNTER — Encounter: Payer: Self-pay | Admitting: Physical Therapy

## 2019-11-25 ENCOUNTER — Other Ambulatory Visit: Payer: Self-pay

## 2019-11-25 DIAGNOSIS — M25671 Stiffness of right ankle, not elsewhere classified: Secondary | ICD-10-CM

## 2019-11-25 DIAGNOSIS — R262 Difficulty in walking, not elsewhere classified: Secondary | ICD-10-CM

## 2019-11-25 DIAGNOSIS — M25571 Pain in right ankle and joints of right foot: Secondary | ICD-10-CM

## 2019-11-25 NOTE — Therapy (Signed)
[facility] [address] [place], [place], [address] Phone: [phone]   Fax:  [phone]  Physical Therapy Treatment  Patient Details  Name: Tiffany Mcintosh MRN: [id number] Date of Birth: September 21, 1966 Referring Provider (PT): Toni Arthurs, MD   Encounter Date: 11/25/2019  PT End of Session - 11/25/19 1605    Visit Number  14    Number of Visits  18    Date for PT Re-Evaluation  12/12/19    Authorization Type  FOTO; progress note every 10th visit    PT Start Time  1519    PT Stop Time  1610    PT Time Calculation (min)  51 min    Activity Tolerance  Patient tolerated treatment well    Behavior During Therapy  [facility] for tasks assessed/performed       History reviewed. No pertinent past medical history.  History reviewed. No pertinent surgical history.  There were no vitals filed for this visit.  Subjective Assessment - 11/25/19 1603    Subjective  COVID 19 screening performed on patient upon arrival. Patient reports her R ankle is much better than she was months ago. Patient reports some lateral ankle pain with prolonged standing or walking.    Limitations  Standing;Walking;House hold activities    Diagnostic tests  x-ray: severe brusing of ankle; MRI: ligaments swollen per patient report    Patient Stated Goals  decrease pain, improve movement    Currently in Pain?  No/denies         [facility] PT Assessment - 11/25/19 0001      Assessment   Medical Diagnosis  Pain of right ankle joint    Referring Provider (PT)  Toni Arthurs, MD    Next MD Visit  12/15/2019 or none    Prior Therapy  no      Precautions   Precautions  None      Restrictions   Weight Bearing Restrictions  No                   [facility] Adult PT Treatment/Exercise - 11/25/19 0001      Modalities   Modalities  Electrical Stimulation;Vasopneumatic      Electrical Stimulation   Electrical Stimulation Location  R lateral foot/ankle    Electrical  Stimulation Action  Pre-Mod    Electrical Stimulation Parameters  80-150 hz x15 min    Electrical Stimulation Goals  Edema      Vasopneumatic   Number Minutes Vasopneumatic   15 minutes    Vasopnuematic Location   Ankle    Vasopneumatic Pressure  Medium    Vasopneumatic Temperature   34      Ankle Exercises: Aerobic   Nustep  L5 x10 min      Ankle Exercises: Standing   Rocker Board  3 minutes   prolonged static gastroc stretch   Heel Raises  Both;20 reps    Toe Raise  20 reps    Other Standing Ankle Exercises  R forward lunges x20 reps      Ankle Exercises: Seated   ABC's  1 rep   1.5# ankle isolator   Ankle Circles/Pumps Limitations  B ankle windshield wipers green theraband x20 reps    Towel Crunch  5 reps;Limitations   wiht 3# resistance   BAPS  Sitting;Level 2;15 reps   PF/DF, Inv/Ev, circles   Other Seated Ankle Exercises  R ankle DF/PF 1.5# ankle isolator x20 reps  PT Long Term Goals - 10/30/19 1121      PT LONG TERM GOAL #1   Title  Patient will be independent with HEP    Time  6    Period  Weeks    Status  Achieved      PT LONG TERM GOAL #2   Title  Patient will demonstrate 6+ degrees of right ankle DF AROM to improve gait mechanics.    Time  6    Period  Weeks    Status  On-going   AROM 3 degrees DF 10/23/19     PT LONG TERM GOAL #3   Title  Patient will report ability to walk community distances with minimal gait deviations and right ankle pain less than or equal to 4/10.    Time  6    Period  Weeks    Status  Achieved   Limited to 30-45 minutes per patient report due to fatigue and discomfort     PT LONG TERM GOAL #4   Title  Patient will demonstrate 4+/5 or greater of right ankle MMT to improve stability during functional tasks.    Time  6    Period  Weeks    Status  On-going   NT 10/23/19     PT LONG TERM GOAL #5   Title  Patient will report ability to perform ADLs and work activities with pain less than or equal to  4/10.    Time  6    Period  Weeks    Status  Achieved            Plan - 11/25/19 1606    Clinical Impression Statement  Patient presented in clinic with reports of only intermittant lateral R ankle discomfort especially with prolonged standing or walking. Patient able to progress with standing strengthening and no pain during standing heel raises. More resisted intrinsic foot strengthening completed along with lateral ankle strengthening. Patient reported a surge type sensation in 4-5th metatarsals with seated resisted windshield wipers. Minimal-moderate R ankle edema still notable around lateral malleoli and along dorsal 4-5th metatarsals. Normal modalities response noted following removal of the modalities.    Personal Factors and Comorbidities  Age    Examination-Activity Limitations  Locomotion Level;Transfers;Squat;Stairs;Stand    Stability/Clinical Decision Making  Stable/Uncomplicated    Rehab Potential  Good    PT Frequency  2x / week    PT Duration  6 weeks    PT Treatment/Interventions  ADLs/Self Care Home Management;Iontophoresis 4mg /ml Dexamethasone;Moist Heat;Ultrasound;Cryotherapy;Occupational psychologist;Therapeutic activities;Functional mobility training;Therapeutic exercise;Balance training;Neuromuscular re-education;Patient/family education;Passive range of motion;Vasopneumatic Device;Taping;Manual techniques    PT Next Visit Plan  Continue with focus on ankle and intrinsic foot strengthening.    PT Home Exercise Plan  see patient education section.    Consulted and Agree with Plan of Care  Patient       Patient will benefit from skilled therapeutic intervention in order to improve the following deficits and impairments:  Abnormal gait, Pain, Decreased activity tolerance, Decreased balance, Increased edema, Decreased strength, Difficulty walking, Decreased range of motion  Visit Diagnosis: Stiffness of right ankle, not elsewhere  classified  Pain in right ankle and joints of right foot  Difficulty in walking, not elsewhere classified     Problem List There are no problems to display for this patient.   Standley Brooking, PTA 11/25/2019, 4:21 PM  [facility] [address] [place], [place], [address] Phone: [phone]   Fax:  [phone]  Name: Tiffany Mcintosh MRN: [id number] Date of Birth: 05-02-1966

## 2019-12-02 ENCOUNTER — Ambulatory Visit: Payer: BC Managed Care – PPO | Admitting: Physical Therapy

## 2019-12-02 ENCOUNTER — Encounter: Payer: Self-pay | Admitting: Physical Therapy

## 2019-12-02 ENCOUNTER — Other Ambulatory Visit: Payer: Self-pay

## 2019-12-02 DIAGNOSIS — M25671 Stiffness of right ankle, not elsewhere classified: Secondary | ICD-10-CM | POA: Diagnosis not present

## 2019-12-02 DIAGNOSIS — M25571 Pain in right ankle and joints of right foot: Secondary | ICD-10-CM

## 2019-12-02 DIAGNOSIS — R262 Difficulty in walking, not elsewhere classified: Secondary | ICD-10-CM

## 2019-12-02 NOTE — Therapy (Signed)
[facility] [address] [place], [place], [address] Phone: [phone]   Fax:  [phone]  Physical Therapy Treatment  Patient Details  Name: Tiffany Mcintosh MRN: [id number] Date of Birth: 1966/07/04 Referring Provider (PT): Toni Arthurs, MD   Encounter Date: 12/02/2019  PT End of Session - 12/02/19 1127    Visit Number  15    Number of Visits  18    Date for PT Re-Evaluation  12/12/19    Authorization Type  FOTO; progress note every 10th visit    PT Start Time  1115    PT Stop Time  1206    PT Time Calculation (min)  51 min    Activity Tolerance  Patient tolerated treatment well    Behavior During Therapy  [facility] for tasks assessed/performed       History reviewed. No pertinent past medical history.  History reviewed. No pertinent surgical history.  There were no vitals filed for this visit.  Subjective Assessment - 12/02/19 1123    Subjective  COVID 19 screening performed on patient upon arrival. Patient reports her R ankle is good but reports ongoing lateral foot pain, 2/10    Limitations  Standing;Walking;House hold activities    Diagnostic tests  x-ray: severe brusing of ankle; MRI: ligaments swollen per patient report    Patient Stated Goals  decrease pain, improve movement    Currently in Pain?  Yes    Pain Score  2     Pain Location  Foot    Pain Orientation  Right;Lateral    Pain Descriptors / Indicators  Aching    Pain Type  Chronic pain    Pain Onset  More than a month ago    Pain Frequency  Constant         [facility] PT Assessment - 12/02/19 0001      Assessment   Medical Diagnosis  Pain of right ankle joint    Referring Provider (PT)  Toni Arthurs, MD    Next MD Visit  12/15/2019 or none    Prior Therapy  no      Precautions   Precautions  None      Restrictions   Weight Bearing Restrictions  No                   [facility] Adult PT Treatment/Exercise - 12/02/19 0001      Modalities   Modalities   Electrical Stimulation;Vasopneumatic      Electrical Stimulation   Electrical Stimulation Location  R lateral foot/ankle    Electrical Stimulation Action  pre-mod    Electrical Stimulation Parameters  80-150 hz x15 mins    Electrical Stimulation Goals  Edema      Vasopneumatic   Number Minutes Vasopneumatic   15 minutes    Vasopnuematic Location   Ankle    Vasopneumatic Pressure  Medium    Vasopneumatic Temperature   34      Ankle Exercises: Aerobic   Nustep  L4 x10 min      Ankle Exercises: Standing   Rocker Board  3 minutes    Heel Raises  Both;20 reps   on air rex   Toe Raise  20 reps   on airex     Ankle Exercises: Seated   ABC's  1 rep   Ankle isolator 1.5#    BAPS  Sitting;Level 2;15 reps   PF/DF, IN/EV, circles   Other Seated Ankle Exercises  4 way ankle 2x10 green theraband  Other Seated Ankle Exercises  R ankle DF/PF 1.5# ankle isolator x20 reps                  PT Long Term Goals - 10/30/19 1121      PT LONG TERM GOAL #1   Title  Patient will be independent with HEP    Time  6    Period  Weeks    Status  Achieved      PT LONG TERM GOAL #2   Title  Patient will demonstrate 6+ degrees of right ankle DF AROM to improve gait mechanics.    Time  6    Period  Weeks    Status  On-going   AROM 3 degrees DF 10/23/19     PT LONG TERM GOAL #3   Title  Patient will report ability to walk community distances with minimal gait deviations and right ankle pain less than or equal to 4/10.    Time  6    Period  Weeks    Status  Achieved   Limited to 30-45 minutes per patient report due to fatigue and discomfort     PT LONG TERM GOAL #4   Title  Patient will demonstrate 4+/5 or greater of right ankle MMT to improve stability during functional tasks.    Time  6    Period  Weeks    Status  On-going   NT 10/23/19     PT LONG TERM GOAL #5   Title  Patient will report ability to perform ADLs and work activities with pain less than or equal to 4/10.     Time  6    Period  Weeks    Status  Achieved            Plan - 12/02/19 1257    Clinical Impression Statement  Patient was able to tolerate treatment well with progression of TEs. Patient still has discomofrt in the lateral aspect of the foot and ankle which is her biggest limitation and reported discomfort with heel raises and toe rasies on foam but reports it dissipates after a couple of reps. Patient and PT discussed self metatasal mobilization as well as calcaneal mobilization. Patient demonstrated good form and technique as well as reported understanding. No adverse affects upon removal of modalities.    Personal Factors and Comorbidities  Age    Examination-Activity Limitations  Locomotion Level;Transfers;Squat;Stairs;Stand    Stability/Clinical Decision Making  Stable/Uncomplicated    Clinical Decision Making  Low    Rehab Potential  Good    PT Frequency  2x / week    PT Duration  6 weeks    PT Treatment/Interventions  ADLs/Self Care Home Management;Iontophoresis 4mg /ml Dexamethasone;Moist Heat;Ultrasound;Cryotherapy; ;Therapeutic activities;Functional mobility training;Therapeutic exercise;Balance training;Neuromuscular re-education;Patient/family education;Passive range of motion;Vasopneumatic Device;Taping;Manual techniques    PT Next Visit Plan  MD note and goals. Continue with focus on ankle and intrinsic foot strengthening.    PT Home Exercise Plan  see patient education section.    Consulted and Agree with Plan of Care  Patient       Patient will benefit from skilled therapeutic intervention in order to improve the following deficits and impairments:  Abnormal gait, Pain, Decreased activity tolerance, Decreased balance, Increased edema, Decreased strength, Difficulty walking, Decreased range of motion  Visit Diagnosis: Stiffness of right ankle, not elsewhere classified  Pain in right ankle and joints of right  foot  Difficulty in walking, not elsewhere classified  Problem List There are no problems to display for this patient.   Gabriela Eves, PT, DPT 12/02/2019, 1:07 PM  [facility] [place], [place], [address] Phone: [phone]   Fax:  [phone]  Name: Tiffany Mcintosh MRN: [id number] Date of Birth: January 14, 1966

## 2019-12-09 ENCOUNTER — Ambulatory Visit: Payer: BC Managed Care – PPO | Admitting: Physical Therapy

## 2019-12-09 ENCOUNTER — Other Ambulatory Visit: Payer: Self-pay

## 2019-12-09 ENCOUNTER — Encounter: Payer: Self-pay | Admitting: Physical Therapy

## 2019-12-09 DIAGNOSIS — M25671 Stiffness of right ankle, not elsewhere classified: Secondary | ICD-10-CM

## 2019-12-09 DIAGNOSIS — R262 Difficulty in walking, not elsewhere classified: Secondary | ICD-10-CM

## 2019-12-09 DIAGNOSIS — M25571 Pain in right ankle and joints of right foot: Secondary | ICD-10-CM

## 2019-12-09 NOTE — Therapy (Addendum)
[facility] [address] [place], [place], [address] Phone: [phone]   Fax:  [phone]  Physical Therapy Treatment  PHYSICAL THERAPY DISCHARGE SUMMARY  Visits from Start of Care: 16  Current functional level related to goals / functional outcomes: See below   Remaining deficits: See goals   Education / Equipment: HEP Plan: Patient agrees to discharge.  Patient goals were partially met. Patient is being discharged due to not returning since the last visit.  ?????  Krystle Mangawang, PT, DPT     Patient Details  Name: Tiffany Mcintosh MRN: [id number] Date of Birth: 01/12/1966 Referring Provider (PT): John Hewitt, MD   Encounter Date: 12/09/2019  PT End of Session - 12/09/19 1722    Visit Number  16    Number of Visits  18    Date for PT Re-Evaluation  12/12/19    Authorization Type  FOTO; progress note every 10th visit    PT Start Time  1115    PT Stop Time  1202    PT Time Calculation (min)  47 min    Activity Tolerance  Patient tolerated treatment well    Behavior During Therapy  [facility] for tasks assessed/performed       History reviewed. No pertinent past medical history.  History reviewed. No pertinent surgical history.  There were no vitals filed for this visit.  Subjective Assessment - 12/09/19 1237    Subjective  COVID 19 screening performed on patient upon arrival. Patient reports 1/10 pain currently still has antalgic gait pattern particularly with increased gait speed.    Limitations  Standing;Walking;House hold activities    Diagnostic tests  x-ray: severe brusing of ankle; MRI: ligaments swollen per patient report    Patient Stated Goals  decrease pain, improve movement    Currently in Pain?  Yes    Pain Location  Foot    Pain Orientation  Right;Left    Pain Descriptors / Indicators  Aching    Pain Type  Chronic pain    Pain Onset  More than a month ago    Pain Frequency  Constant         [facility] PT  Assessment - 12/09/19 0001      Assessment   Medical Diagnosis  Pain of right ankle joint    Referring Provider (PT)  John Hewitt, MD    Next MD Visit  12/15/2019 or none    Prior Therapy  no      Precautions   Precautions  None      AROM   Right Ankle Dorsiflexion  6    Right Ankle Plantar Flexion  52      PROM   Right Ankle Dorsiflexion  --    Right Ankle Plantar Flexion  --      Strength   Right Ankle Dorsiflexion  5/5    Right Ankle Plantar Flexion  5/5    Right Ankle Inversion  5/5    Right Ankle Eversion  4+/5                   [facility] Adult PT Treatment/Exercise - 12/09/19 0001      Ambulation/Gait   Ambulation Distance (Feet)  70 Feet    Gait Comments  Normal gait speed WFL, increased gait speed increased deviations cuing for longer left step length and toe off      Modalities   Modalities  --      Electrical Stimulation   Electrical Stimulation Location  --      Electrical Stimulation Action  --    Electrical Stimulation Parameters  --    Electrical Stimulation Goals  --      Vasopneumatic   Number Minutes Vasopneumatic   --    Vasopnuematic Location   --    Vasopneumatic Pressure  --    Vasopneumatic Temperature   --      Ankle Exercises: Aerobic   Nustep  L4 x10 min      Ankle Exercises: Standing   SLS  3x30" then 2x30" on foam    Rocker Board  3 minutes   sustained stretch   Heel Raises  10 reps;20 reps;Both   x30 on foam   Toe Raise  10 reps;20 reps   x30 on foam                 PT Long Term Goals - 12/09/19 1152      PT LONG TERM GOAL #1   Title  Patient will be independent with HEP      PT LONG TERM GOAL #2   Title  Patient will demonstrate 6+ degrees of right ankle DF AROM to improve gait mechanics.    Time  6    Period  Weeks    Status  Achieved      PT LONG TERM GOAL #3   Title  Patient will report ability to walk community distances with minimal gait deviations and right ankle pain less than or equal to 4/10.     Time  6    Period  Weeks    Status  On-going      PT LONG TERM GOAL #4   Title  Patient will demonstrate 4+/5 or greater of right ankle MMT to improve stability during functional tasks.    Period  Weeks    Status  On-going      PT LONG TERM GOAL #5   Title  Patient will report ability to perform ADLs and work activities with pain less than or equal to 4/10.    Time  6    Period  Weeks    Status  Achieved            Plan - 12/09/19 1251    Clinical Impression Statement  Patient was able to tolerate treatment well with progression of TEs and balance actvities. Patient noted good use of ankle strategies although patient reports still having pain with releasing pressure from right LE. Patient's ROM has improved since start of therapy and reports an improvement in all ADLs but still ongoing pain and limping with prolonged walking and with fast walking speeds. Patient to see MD on Monday, 12/15/2019 for a follow up. Patient and PT discussed the addition of a couple more visits to address remaining goals. Patient opted out of modalities today.    Personal Factors and Comorbidities  Age    Examination-Activity Limitations  Locomotion Level;Transfers;Squat;Stairs;Stand    Stability/Clinical Decision Making  Stable/Uncomplicated    Clinical Decision Making  Low    Rehab Potential  Good    PT Frequency  2x / week    PT Duration  6 weeks    PT Treatment/Interventions  ADLs/Self Care Home Management;Iontophoresis 4mg/ml Dexamethasone;Moist Heat;Ultrasound;Cryotherapy;Electrical Stimulation;Gait training;Stair training;Therapeutic activities;Functional mobility training;Therapeutic exercise;Balance training;Neuromuscular re-education;Patient/family education;Passive range of motion;Vasopneumatic Device;Taping;Manual techniques    PT Next Visit Plan  Continue with focus on ankle and intrinsic foot strengthening.    Consulted and Agree with Plan of Care  Patient         Patient will benefit from  skilled therapeutic intervention in order to improve the following deficits and impairments:  Abnormal gait, Pain, Decreased activity tolerance, Decreased balance, Increased edema, Decreased strength, Difficulty walking, Decreased range of motion  Visit Diagnosis: Stiffness of right ankle, not elsewhere classified  Pain in right ankle and joints of right foot  Difficulty in walking, not elsewhere classified     Problem List There are no problems to display for this patient.   Gabriela Eves, PT, DPT 12/09/2019, 5:31 PM  [facility] [address] [place], [place], [address] Phone: [phone]   Fax:  [phone]  Name: Tiffany Mcintosh MRN: [id number] Date of Birth: [birth year]

## 2019-12-10 DIAGNOSIS — Z90710 Acquired absence of both cervix and uterus: Secondary | ICD-10-CM | POA: Diagnosis not present

## 2019-12-10 DIAGNOSIS — Z01419 Encounter for gynecological examination (general) (routine) without abnormal findings: Secondary | ICD-10-CM | POA: Diagnosis not present

## 2019-12-10 DIAGNOSIS — N3010 Interstitial cystitis (chronic) without hematuria: Secondary | ICD-10-CM | POA: Diagnosis not present

## 2019-12-10 DIAGNOSIS — Z1211 Encounter for screening for malignant neoplasm of colon: Secondary | ICD-10-CM | POA: Diagnosis not present

## 2019-12-15 ENCOUNTER — Encounter: Payer: BC Managed Care – PPO | Admitting: Physical Therapy

## 2019-12-19 ENCOUNTER — Ambulatory Visit: Payer: BC Managed Care – PPO | Admitting: Physical Therapy

## 2019-12-23 ENCOUNTER — Ambulatory Visit: Payer: BC Managed Care – PPO | Admitting: Physical Therapy

## 2020-01-22 DIAGNOSIS — Z6829 Body mass index (BMI) 29.0-29.9, adult: Secondary | ICD-10-CM | POA: Diagnosis not present

## 2020-01-22 DIAGNOSIS — Z0000 Encounter for general adult medical examination without abnormal findings: Secondary | ICD-10-CM | POA: Diagnosis not present

## 2020-02-11 DIAGNOSIS — M542 Cervicalgia: Secondary | ICD-10-CM | POA: Diagnosis not present

## 2020-02-11 DIAGNOSIS — M62830 Muscle spasm of back: Secondary | ICD-10-CM | POA: Diagnosis not present

## 2020-02-11 DIAGNOSIS — M5137 Other intervertebral disc degeneration, lumbosacral region: Secondary | ICD-10-CM | POA: Diagnosis not present

## 2020-02-11 DIAGNOSIS — M545 Low back pain: Secondary | ICD-10-CM | POA: Diagnosis not present

## 2020-02-17 DIAGNOSIS — M5414 Radiculopathy, thoracic region: Secondary | ICD-10-CM | POA: Diagnosis not present

## 2020-02-17 DIAGNOSIS — M5137 Other intervertebral disc degeneration, lumbosacral region: Secondary | ICD-10-CM | POA: Diagnosis not present

## 2020-02-17 DIAGNOSIS — M542 Cervicalgia: Secondary | ICD-10-CM | POA: Diagnosis not present

## 2020-02-17 DIAGNOSIS — R202 Paresthesia of skin: Secondary | ICD-10-CM | POA: Diagnosis not present

## 2020-03-03 DIAGNOSIS — M542 Cervicalgia: Secondary | ICD-10-CM | POA: Diagnosis not present

## 2020-03-03 DIAGNOSIS — R202 Paresthesia of skin: Secondary | ICD-10-CM | POA: Diagnosis not present

## 2020-03-03 DIAGNOSIS — M5137 Other intervertebral disc degeneration, lumbosacral region: Secondary | ICD-10-CM | POA: Diagnosis not present

## 2020-03-03 DIAGNOSIS — M5414 Radiculopathy, thoracic region: Secondary | ICD-10-CM | POA: Diagnosis not present

## 2020-03-16 DIAGNOSIS — M5414 Radiculopathy, thoracic region: Secondary | ICD-10-CM | POA: Diagnosis not present

## 2020-03-16 DIAGNOSIS — R202 Paresthesia of skin: Secondary | ICD-10-CM | POA: Diagnosis not present

## 2020-03-16 DIAGNOSIS — M542 Cervicalgia: Secondary | ICD-10-CM | POA: Diagnosis not present

## 2020-03-16 DIAGNOSIS — M5137 Other intervertebral disc degeneration, lumbosacral region: Secondary | ICD-10-CM | POA: Diagnosis not present

## 2020-04-07 DIAGNOSIS — M62830 Muscle spasm of back: Secondary | ICD-10-CM | POA: Diagnosis not present

## 2020-04-07 DIAGNOSIS — M542 Cervicalgia: Secondary | ICD-10-CM | POA: Diagnosis not present

## 2020-04-07 DIAGNOSIS — M545 Low back pain: Secondary | ICD-10-CM | POA: Diagnosis not present

## 2020-04-07 DIAGNOSIS — M5137 Other intervertebral disc degeneration, lumbosacral region: Secondary | ICD-10-CM | POA: Diagnosis not present

## 2020-04-21 DIAGNOSIS — M542 Cervicalgia: Secondary | ICD-10-CM | POA: Diagnosis not present

## 2020-04-21 DIAGNOSIS — M545 Low back pain: Secondary | ICD-10-CM | POA: Diagnosis not present

## 2020-04-21 DIAGNOSIS — M62830 Muscle spasm of back: Secondary | ICD-10-CM | POA: Diagnosis not present

## 2020-04-21 DIAGNOSIS — M5137 Other intervertebral disc degeneration, lumbosacral region: Secondary | ICD-10-CM | POA: Diagnosis not present

## 2020-06-08 DIAGNOSIS — M62830 Muscle spasm of back: Secondary | ICD-10-CM | POA: Diagnosis not present

## 2020-06-08 DIAGNOSIS — M542 Cervicalgia: Secondary | ICD-10-CM | POA: Diagnosis not present

## 2020-06-08 DIAGNOSIS — M5137 Other intervertebral disc degeneration, lumbosacral region: Secondary | ICD-10-CM | POA: Diagnosis not present

## 2020-06-08 DIAGNOSIS — M545 Low back pain: Secondary | ICD-10-CM | POA: Diagnosis not present

## 2020-06-09 DIAGNOSIS — M545 Low back pain: Secondary | ICD-10-CM | POA: Diagnosis not present

## 2020-06-09 DIAGNOSIS — M542 Cervicalgia: Secondary | ICD-10-CM | POA: Diagnosis not present

## 2020-06-09 DIAGNOSIS — M62830 Muscle spasm of back: Secondary | ICD-10-CM | POA: Diagnosis not present

## 2020-06-09 DIAGNOSIS — M5137 Other intervertebral disc degeneration, lumbosacral region: Secondary | ICD-10-CM | POA: Diagnosis not present

## 2020-06-15 DIAGNOSIS — M545 Low back pain: Secondary | ICD-10-CM | POA: Diagnosis not present

## 2020-06-15 DIAGNOSIS — M25561 Pain in right knee: Secondary | ICD-10-CM | POA: Diagnosis not present

## 2020-06-15 DIAGNOSIS — M5137 Other intervertebral disc degeneration, lumbosacral region: Secondary | ICD-10-CM | POA: Diagnosis not present

## 2020-06-15 DIAGNOSIS — M25461 Effusion, right knee: Secondary | ICD-10-CM | POA: Diagnosis not present

## 2020-06-21 DIAGNOSIS — M545 Low back pain: Secondary | ICD-10-CM | POA: Diagnosis not present

## 2020-06-21 DIAGNOSIS — M25461 Effusion, right knee: Secondary | ICD-10-CM | POA: Diagnosis not present

## 2020-06-21 DIAGNOSIS — M5137 Other intervertebral disc degeneration, lumbosacral region: Secondary | ICD-10-CM | POA: Diagnosis not present

## 2020-06-21 DIAGNOSIS — M25561 Pain in right knee: Secondary | ICD-10-CM | POA: Diagnosis not present

## 2020-06-24 DIAGNOSIS — M546 Pain in thoracic spine: Secondary | ICD-10-CM | POA: Diagnosis not present

## 2020-06-24 DIAGNOSIS — M545 Low back pain: Secondary | ICD-10-CM | POA: Diagnosis not present

## 2020-06-24 DIAGNOSIS — M542 Cervicalgia: Secondary | ICD-10-CM | POA: Diagnosis not present

## 2020-06-24 DIAGNOSIS — M5137 Other intervertebral disc degeneration, lumbosacral region: Secondary | ICD-10-CM | POA: Diagnosis not present

## 2020-07-27 DIAGNOSIS — M542 Cervicalgia: Secondary | ICD-10-CM | POA: Diagnosis not present

## 2020-07-27 DIAGNOSIS — M5137 Other intervertebral disc degeneration, lumbosacral region: Secondary | ICD-10-CM | POA: Diagnosis not present

## 2020-07-27 DIAGNOSIS — M62830 Muscle spasm of back: Secondary | ICD-10-CM | POA: Diagnosis not present

## 2020-07-27 DIAGNOSIS — M25561 Pain in right knee: Secondary | ICD-10-CM | POA: Diagnosis not present

## 2020-07-29 DIAGNOSIS — H5210 Myopia, unspecified eye: Secondary | ICD-10-CM | POA: Diagnosis not present

## 2020-07-29 DIAGNOSIS — H52223 Regular astigmatism, bilateral: Secondary | ICD-10-CM | POA: Diagnosis not present

## 2020-07-29 DIAGNOSIS — H524 Presbyopia: Secondary | ICD-10-CM | POA: Diagnosis not present

## 2020-07-29 DIAGNOSIS — H5203 Hypermetropia, bilateral: Secondary | ICD-10-CM | POA: Diagnosis not present

## 2020-09-14 ENCOUNTER — Telehealth: Payer: Self-pay | Admitting: *Deleted

## 2020-09-14 ENCOUNTER — Emergency Department (HOSPITAL_COMMUNITY): Payer: BC Managed Care – PPO

## 2020-09-14 ENCOUNTER — Other Ambulatory Visit: Payer: Self-pay

## 2020-09-14 ENCOUNTER — Encounter: Payer: Self-pay | Admitting: *Deleted

## 2020-09-14 ENCOUNTER — Encounter (HOSPITAL_COMMUNITY): Payer: Self-pay

## 2020-09-14 ENCOUNTER — Emergency Department (HOSPITAL_COMMUNITY)
Admission: EM | Admit: 2020-09-14 | Discharge: 2020-09-14 | Disposition: A | Discharge status: Home / Self Care | Payer: BC Managed Care – PPO | Attending: Emergency Medicine | Admitting: Emergency Medicine

## 2020-09-14 DIAGNOSIS — R079 Chest pain, unspecified: Secondary | ICD-10-CM | POA: Insufficient documentation

## 2020-09-14 DIAGNOSIS — F17210 Nicotine dependence, cigarettes, uncomplicated: Secondary | ICD-10-CM | POA: Insufficient documentation

## 2020-09-14 DIAGNOSIS — Z20822 Contact with and (suspected) exposure to covid-19: Secondary | ICD-10-CM | POA: Insufficient documentation

## 2020-09-14 DIAGNOSIS — R0789 Other chest pain: Secondary | ICD-10-CM | POA: Diagnosis not present

## 2020-09-14 HISTORY — DX: Endometriosis, unspecified: N80.9

## 2020-09-14 HISTORY — DX: Obstructive sleep apnea (adult) (pediatric): G47.33

## 2020-09-14 HISTORY — DX: Unspecified osteoarthritis, unspecified site: M19.90

## 2020-09-14 LAB — BASIC METABOLIC PANEL
Anion gap: 8 (ref 5–15)
BUN: 13 mg/dL (ref 6–20)
CO2: 25 mmol/L (ref 22–32)
Calcium: 9.3 mg/dL (ref 8.9–10.3)
Chloride: 104 mmol/L (ref 98–111)
Creatinine, Ser: 0.94 mg/dL (ref 0.44–1.00)
GFR, Estimated: >60 mL/min (ref >60)
Glucose, Bld: 101 mg/dL — ABNORMAL HIGH (ref 70–99)
Potassium: 4.2 mmol/L (ref 3.5–5.1)
Sodium: 137 mmol/L (ref 135–145)

## 2020-09-14 LAB — TROPONIN I (HIGH SENSITIVITY)
Troponin I (High Sensitivity): <2 ng/L (ref <18)
Troponin I (High Sensitivity): <2 ng/L (ref <18)

## 2020-09-14 LAB — CBC
HCT: 44 % (ref 36.0–46.0)
Hemoglobin: 14.6 g/dL (ref 12.0–15.0)
MCH: 31.9 pg (ref 26.0–34.0)
MCHC: 33.2 g/dL (ref 30.0–36.0)
MCV: 96.1 fL (ref 80.0–100.0)
Platelets: 225 10*3/uL (ref 150–400)
RBC: 4.58 MIL/uL (ref 3.87–5.11)
RDW: 12.4 % (ref 11.5–15.5)
WBC: 6.7 10*3/uL (ref 4.0–10.5)
nRBC: 0 % (ref 0.0–0.2)

## 2020-09-14 LAB — RESPIRATORY PANEL BY RT PCR (FLU A&B, COVID)
Influenza A by PCR: NEGATIVE
Influenza B by PCR: NEGATIVE
SARS Coronavirus 2 by RT PCR: NEGATIVE

## 2020-09-14 NOTE — Discharge Instructions (Addendum)
Your heart tests in the ED were normal.  Take baby aspirin daily until you see the cardiologist.  Tylenol every 4 hours as needed for pain.  Return for new, worsening or persistent chest pain or shortness of breath.  Arrange stress test with cardiology.

## 2020-09-14 NOTE — ED Provider Notes (Signed)
[facility] EMERGENCY DEPARTMENT Provider Note   CSN: [id number] Arrival date & time: 09/14/20  1031     History Chief Complaint  Patient presents with  . Chest Pain    Tiffany Mcintosh is a 54 y.o. female.  Patient with history of endometriosis, cigarette smoking presents with recurrent episodes of chest tightness that wakes her from sleep early in the morning over the past 2 to 3 weeks in addition she has had intermittent lightheadedness and jaw/tooth pain.  No fevers chills cough or shortness of breath.  No blood clot or cardiac history.  Symptoms last 5 to 10 minutes.  Possible episodes of exertional component.        Past Medical History:  Diagnosis Date  . Arthritis   . Endometriosis   . Sleep apnea, obstructive     There are no problems to display for this patient.   Past Surgical History:  Procedure Laterality Date  . ABDOMINAL HYSTERECTOMY    . APPENDECTOMY    . CHOLECYSTECTOMY    . tendon repair       OB History   No obstetric history on file.     No family history on file.  Social History   Tobacco Use  . Smoking status: Current Every Day Smoker    Packs/day: 0.50    Types: Cigarettes  . Smokeless tobacco: Never Used  Vaping Use  . Vaping Use: Every day  . Substances: Nicotine  Substance Use Topics  . Alcohol use: Yes    Comment: socially  . Drug use: Not Currently    Home Medications Prior to Admission medications   Medication Sig Start Date End Date Taking? Authorizing Provider  estradiol (VIVELLE-DOT) 0.05 MG/24HR patch Place 1 patch onto the skin 2 (two) times a week.   Yes [provider]  Famotidine (PEPCID AC PO) Take by mouth.   Yes [provider]  fexofenadine (ALLEGRA) 180 MG tablet Take 180 mg by mouth daily.   Yes [provider]  FLUoxetine (PROZAC) 20 MG capsule Take 20 mg by mouth daily. 09/13/20  Yes [provider]    Allergies    Sulfa antibiotics  Review of Systems   Review of  Systems  Constitutional: Negative for chills and fever.  HENT: Negative for congestion.   Eyes: Negative for visual disturbance.  Respiratory: Negative for shortness of breath.   Cardiovascular: Positive for chest pain. Negative for leg swelling.  Gastrointestinal: Negative for abdominal pain and vomiting.  Genitourinary: Negative for dysuria and flank pain.  Musculoskeletal: Negative for back pain, neck pain and neck stiffness.  Skin: Negative for rash.  Neurological: Negative for light-headedness and headaches.    Physical Exam Updated Vital Signs BP (!) 112/51   Pulse 63   Resp 16   Ht 5' 7.5" (1.715 m)   Wt 86.2 kg   SpO2 95%   BMI 29.32 kg/m   Physical Exam Vitals and nursing note reviewed.  Constitutional:      Appearance: She is well-developed.  HENT:     Head: Normocephalic and atraumatic.  Eyes:     General:        Right eye: No discharge.        Left eye: No discharge.     Conjunctiva/sclera: Conjunctivae normal.  Neck:     Trachea: No tracheal deviation.  Cardiovascular:     Rate and Rhythm: Normal rate and regular rhythm.  Pulmonary:     Effort: Pulmonary effort is normal.  Breath sounds: Normal breath sounds.  Abdominal:     General: There is no distension.     Palpations: Abdomen is soft.     Tenderness: There is no abdominal tenderness. There is no guarding.  Musculoskeletal:     Cervical back: Normal range of motion and neck supple.  Skin:    General: Skin is warm.     Findings: No rash.  Neurological:     Mental Status: She is alert and oriented to person, place, and time.     ED Results / Procedures / Treatments   Labs (all labs ordered are listed, but only abnormal results are displayed) Labs Reviewed  BASIC METABOLIC PANEL - Abnormal; Notable for the following components:      Result Value   Glucose, Bld 101 (*)    All other components within normal limits  RESPIRATORY PANEL BY RT PCR (FLU A&B, COVID)  CBC  POC URINE PREG, ED   TROPONIN I (HIGH SENSITIVITY)  TROPONIN I (HIGH SENSITIVITY)    EKG EKG Interpretation  Date/Time:  Tuesday September 14 2020 10:35:06 EDT Ventricular Rate:  72 PR Interval:  138 QRS Duration: 88 QT Interval:  382 QTC Calculation: 418 R Axis:   88 Text Interpretation: Normal sinus rhythm Normal ECG Confirmed by Blane Ohara [phone]) on 09/14/2020 11:45:58 AM   Radiology DG Chest 2 View  Result Date: 09/14/2020 CLINICAL DATA:  Chest pain. EXAM: CHEST - 2 VIEW COMPARISON:  03/12/2012 report. FINDINGS: Mediastinum hilar structures normal. Lungs are clear. No pleural effusion or pneumothorax. No acute bony abnormality. Surgical clips right upper quadrant. IMPRESSION: No acute cardiopulmonary disease. Electronically Signed   By: Maisie Fus  Register   On: 09/14/2020 11:00    Procedures Procedures (including critical care time)  Medications Ordered in ED Medications - No data to display  ED Course  I have reviewed the triage vital signs and the nursing notes.  Pertinent labs & imaging results that were available during my care of the patient were reviewed by me and considered in my medical decision making (see chart for details).    MDM Rules/Calculators/A&P                          Patient presents with intermittent chest pain and accompanied symptoms over the past 2 to 3 weeks.  Patient overall low risk ACS however with worsening episodes cardiac evaluation in the ER performed which included EKG which was reviewed and no acute abnormality sinus rhythm, initial troponin negative, basic blood work no signs of anemia, normal white blood cell count, normal hemoglobin, normal kidney function.  Discussed with Dr. Wyline Mood who agrees with outpatient stress test if second troponin negative.  Patient's having no chest discomfort in the ER.  Delta troponin delayed however result reviewed and negative.  Dr. Wyline Mood arrange stress test and follow-up outpatient.  Final Clinical Impression(s) / ED  Diagnoses Final diagnoses:  Acute chest pain    Rx / DC Orders ED Discharge Orders    None       Blane Ohara, MD 09/14/20 1507

## 2020-09-14 NOTE — Telephone Encounter (Signed)
Can you arrange an outpatient lexiscan for this patient later this week for chest pain, will be discharged from ER per Dr. Wyline Mood

## 2020-09-14 NOTE — Progress Notes (Signed)
Patient discussed with ER staff, presents with chest pain. Fairly atypical symptoms, initial troponin is negative, EKG has not ischemic changes. Ok for discharge if delta troponin is negative, if positive please call us back. We will arrange an outpatient stress test for this week.    Dina Rich MD

## 2020-09-14 NOTE — ED Triage Notes (Signed)
Pt presents to ED with complaints of chest pressure, right shoulder pain, dizziness, and teeth pain. Pt states it woke her up at 0300 this morning. Pt states she has had 2 other episodes in the last 3 weeks.

## 2020-09-17 ENCOUNTER — Encounter (HOSPITAL_COMMUNITY): Payer: Self-pay

## 2020-09-17 ENCOUNTER — Encounter (HOSPITAL_BASED_OUTPATIENT_CLINIC_OR_DEPARTMENT_OTHER)
Admit: 2020-09-17 | Discharge: 2020-09-17 | Disposition: A | Discharge status: Home / Self Care | Payer: BC Managed Care – PPO | Attending: Cardiology | Admitting: Cardiology

## 2020-09-17 ENCOUNTER — Other Ambulatory Visit: Payer: Self-pay

## 2020-09-17 ENCOUNTER — Ambulatory Visit (HOSPITAL_COMMUNITY)
Admission: RE | Admit: 2020-09-17 | Discharge: 2020-09-17 | Disposition: A | Discharge status: Home / Self Care | Payer: BC Managed Care – PPO | Source: Ambulatory Visit | Attending: Cardiology | Admitting: Cardiology

## 2020-09-17 DIAGNOSIS — R079 Chest pain, unspecified: Secondary | ICD-10-CM | POA: Diagnosis not present

## 2020-09-17 LAB — NM MYOCAR MULTI W/SPECT W/WALL MOTION / EF
LV dias vol: 49 mL (ref 46–106)
LV sys vol: 19 mL
Peak HR: 92 {beats}/min
RATE: 0.6
Rest HR: 60 {beats}/min
SDS: 2
SRS: 2
SSS: 4
TID: 1.39

## 2020-09-17 MED ORDER — SODIUM CHLORIDE FLUSH 0.9 % IV SOLN
INTRAVENOUS | Status: AC
  Administered 2020-09-17: 10 mL via INTRAVENOUS
  Filled 2020-09-17: qty 10

## 2020-09-17 MED ORDER — TECHNETIUM TC 99M TETROFOSMIN IV KIT
30.0000 | PACK | Freq: Once | INTRAVENOUS | Status: AC | PRN
  Administered 2020-09-17: 31 via INTRAVENOUS

## 2020-09-17 MED ORDER — TECHNETIUM TC 99M TETROFOSMIN IV KIT
10.0000 | PACK | Freq: Once | INTRAVENOUS | Status: AC | PRN
  Administered 2020-09-17: 10.3 via INTRAVENOUS

## 2020-09-17 MED ORDER — REGADENOSON 0.4 MG/5ML IV SOLN
INTRAVENOUS | Status: AC
  Administered 2020-09-17: 0.4 mg via INTRAVENOUS
  Filled 2020-09-17: qty 5

## 2020-09-21 ENCOUNTER — Telehealth: Payer: Self-pay | Admitting: *Deleted

## 2020-09-21 NOTE — Telephone Encounter (Signed)
Pt voiced understanding

## 2020-09-21 NOTE — Telephone Encounter (Signed)
-----   Message from Antoine Poche, MD sent at 09/21/2020 10:31 AM EST ----- Normal stress test, should follow up with her pcp after her recent ER visit. At this time does not need to see cardiology  Dominga Ferry MD

## 2020-09-30 DIAGNOSIS — M62830 Muscle spasm of back: Secondary | ICD-10-CM | POA: Diagnosis not present

## 2020-09-30 DIAGNOSIS — R519 Headache, unspecified: Secondary | ICD-10-CM | POA: Diagnosis not present

## 2020-09-30 DIAGNOSIS — M25561 Pain in right knee: Secondary | ICD-10-CM | POA: Diagnosis not present

## 2020-09-30 DIAGNOSIS — M542 Cervicalgia: Secondary | ICD-10-CM | POA: Diagnosis not present

## 2020-10-04 DIAGNOSIS — M25561 Pain in right knee: Secondary | ICD-10-CM | POA: Diagnosis not present

## 2020-10-04 DIAGNOSIS — M542 Cervicalgia: Secondary | ICD-10-CM | POA: Diagnosis not present

## 2020-10-04 DIAGNOSIS — R519 Headache, unspecified: Secondary | ICD-10-CM | POA: Diagnosis not present

## 2020-10-04 DIAGNOSIS — M62830 Muscle spasm of back: Secondary | ICD-10-CM | POA: Diagnosis not present

## 2020-10-11 DIAGNOSIS — M542 Cervicalgia: Secondary | ICD-10-CM | POA: Diagnosis not present

## 2020-10-11 DIAGNOSIS — M62830 Muscle spasm of back: Secondary | ICD-10-CM | POA: Diagnosis not present

## 2020-10-11 DIAGNOSIS — M9901 Segmental and somatic dysfunction of cervical region: Secondary | ICD-10-CM | POA: Diagnosis not present

## 2020-10-11 DIAGNOSIS — R519 Headache, unspecified: Secondary | ICD-10-CM | POA: Diagnosis not present

## 2020-11-02 DIAGNOSIS — M461 Sacroiliitis, not elsewhere classified: Secondary | ICD-10-CM | POA: Diagnosis not present

## 2020-11-02 DIAGNOSIS — M542 Cervicalgia: Secondary | ICD-10-CM | POA: Diagnosis not present

## 2020-11-02 DIAGNOSIS — M62830 Muscle spasm of back: Secondary | ICD-10-CM | POA: Diagnosis not present

## 2020-11-02 DIAGNOSIS — M545 Low back pain, unspecified: Secondary | ICD-10-CM | POA: Diagnosis not present

## 2020-11-11 DIAGNOSIS — M62830 Muscle spasm of back: Secondary | ICD-10-CM | POA: Diagnosis not present

## 2020-11-11 DIAGNOSIS — M545 Low back pain, unspecified: Secondary | ICD-10-CM | POA: Diagnosis not present

## 2020-11-11 DIAGNOSIS — M461 Sacroiliitis, not elsewhere classified: Secondary | ICD-10-CM | POA: Diagnosis not present

## 2020-11-11 DIAGNOSIS — M546 Pain in thoracic spine: Secondary | ICD-10-CM | POA: Diagnosis not present

## 2020-11-22 DIAGNOSIS — J029 Acute pharyngitis, unspecified: Secondary | ICD-10-CM | POA: Diagnosis not present

## 2020-11-22 DIAGNOSIS — R519 Headache, unspecified: Secondary | ICD-10-CM | POA: Diagnosis not present

## 2020-11-22 DIAGNOSIS — R059 Cough, unspecified: Secondary | ICD-10-CM | POA: Diagnosis not present

## 2020-11-22 DIAGNOSIS — R0981 Nasal congestion: Secondary | ICD-10-CM | POA: Diagnosis not present

## 2021-01-21 DIAGNOSIS — Z6832 Body mass index (BMI) 32.0-32.9, adult: Secondary | ICD-10-CM | POA: Diagnosis not present

## 2021-01-21 DIAGNOSIS — Z0000 Encounter for general adult medical examination without abnormal findings: Secondary | ICD-10-CM | POA: Diagnosis not present

## 2021-01-26 DIAGNOSIS — M461 Sacroiliitis, not elsewhere classified: Secondary | ICD-10-CM | POA: Diagnosis not present

## 2021-01-26 DIAGNOSIS — M545 Low back pain, unspecified: Secondary | ICD-10-CM | POA: Diagnosis not present

## 2021-01-26 DIAGNOSIS — M542 Cervicalgia: Secondary | ICD-10-CM | POA: Diagnosis not present

## 2021-01-26 DIAGNOSIS — M546 Pain in thoracic spine: Secondary | ICD-10-CM | POA: Diagnosis not present

## 2021-02-09 ENCOUNTER — Ambulatory Visit (INDEPENDENT_AMBULATORY_CARE_PROVIDER_SITE_OTHER): Payer: BC Managed Care – PPO | Admitting: Nurse Practitioner

## 2021-02-09 ENCOUNTER — Other Ambulatory Visit: Payer: Self-pay

## 2021-02-09 ENCOUNTER — Encounter: Payer: Self-pay | Admitting: Nurse Practitioner

## 2021-02-09 VITALS — BP 113/76 | HR 73 | Temp 97.9°F | Ht 67.0 in | Wt 207.0 lb

## 2021-02-09 DIAGNOSIS — Z0000 Encounter for general adult medical examination without abnormal findings: Secondary | ICD-10-CM | POA: Diagnosis not present

## 2021-02-09 NOTE — Progress Notes (Signed)
New Patient Note  RE: Tiffany Mcintosh MRN: [id number] DOB: 1966-02-19 Date of Office Visit: 02/09/2021  Chief Complaint: Annual Exam  .     History of Present Illness:   Encounter for general adult medical examination. Physical: Patient's last physical exam was 1 year ago .  Weight: not appropriate for height (BMI greater than 27%) ;  Blood Pressure: Normal (BP less than 120/80) ; 113/76 Medical History: Patient history reviewed ; Family history reviewed ;  Allergies Reviewed: No change in current allergies ;  Medications Reviewed: Medications reviewed - no changes ;  Lipids: Normal lipid levels ; labs completed results pending Smoking: Life-long non-smoker ; non-smoker Physical Activity: Exercises at least 3 times per week ;  Alcohol/Drug Use: Is a social-drinker ; No illicit drug use ;  Patient is not afflicted from Stress Incontinence and Urge Incontinence  Safety: reviewed ; Patient wears a seat belt, has smoke detectors, has carbon monoxide detectors, practices appropriate gun safety, and wears sunscreen with extended sun exposure. Dental Care: biannual cleanings, brushes and flosses daily. Ophthalmology/Optometry: Annual visit.  Hearing loss: none Vision impairments: none    Assessment and Plan: Tiffany Mcintosh is a 55 y.o. female with: Annual physical exam Completed annual physical exam.  Education for health maintenance and preventative care provided to patient with printed handouts given.  Labs completed today-CBC, TSH, CMP, lipid panel, HIV antibody, hepatitis C antibody, Follow-up in 1 year  No follow-ups on file.   Diagnostics:   Past Medical History: Patient Active Problem List   Diagnosis Date Noted  . Annual physical exam 02/09/2021   Past Medical History:  Diagnosis Date  . Anxiety   . Arthritis   . Endometriosis   . Sleep apnea, obstructive    Past Surgical History: Past Surgical History:  Procedure Laterality Date  . ABDOMINAL HYSTERECTOMY    .  APPENDECTOMY    . CHOLECYSTECTOMY    . tendon repair     Medication List:  Current Outpatient Medications  Medication Sig Dispense Refill  . estradiol (VIVELLE-DOT) 0.05 MG/24HR patch Place 1 patch onto the skin 2 (two) times a week.    . Famotidine (PEPCID AC PO) Take by mouth.    . fexofenadine (ALLEGRA) 180 MG tablet Take 180 mg by mouth daily.     No current facility-administered medications for this visit.   Allergies: Allergies  Allergen Reactions  . Sulfa Antibiotics Anaphylaxis   Social History: Social History   Socioeconomic History  . Marital status: Significant Other    Spouse name: Not on file  . Number of children: Not on file  . Years of education: Not on file  . Highest education level: Not on file  Occupational History  . Not on file  Tobacco Use  . Smoking status: Former Smoker    Packs/day: 0.50    Years: 0.50    Pack years: 0.25    Types: Cigarettes    Quit date: 12/12/2020    Years since quitting: 0.1  . Smokeless tobacco: Never Used  Vaping Use  . Vaping Use: Every day  . Substances: Nicotine  Substance and Sexual Activity  . Alcohol use: Yes    Comment: socially  . Drug use: Not Currently  . Sexual activity: Yes    Birth control/protection: Surgical  Other Topics Concern  . Not on file  Social History Narrative  . Not on file   Social Determinants of Health   Financial Resource Strain: Not on file  Food Insecurity: Not on  file  Transportation Needs: Not on file  Physical Activity: Not on file  Stress: Not on file  Social Connections: Not on file       Family History: Family History  Problem Relation Age of Onset  . Hyperlipidemia Mother   . Hypertension Mother   . COPD Mother   . Depression Mother   . Anxiety disorder Mother   . Alcohol abuse Father          Review of Systems  HENT: Negative.   Eyes: Negative.   Cardiovascular: Negative.   Gastrointestinal: Negative.   Endocrine: Negative.   Genitourinary:  Negative.   Musculoskeletal: Negative.   Skin: Negative.   Neurological: Negative.   Psychiatric/Behavioral: Negative.   All other systems reviewed and are negative.  Objective: BP 113/76   Pulse 73   Temp 97.9 F (36.6 C) (Temporal)   Ht 5\' 7"  (1.702 m)   Wt 207 lb (93.9 kg)   SpO2 99%   BMI 32.42 kg/m  Body mass index is 32.42 kg/m. Physical Exam Vitals reviewed.  Constitutional:      General: She is awake.     Appearance: Normal appearance. She is well-groomed.     Interventions: Face mask in place.  HENT:     Head: Normocephalic.     Nose: Nose normal.  Eyes:     Conjunctiva/sclera: Conjunctivae normal.  Cardiovascular:     Rate and Rhythm: Normal rate and regular rhythm.     Pulses: Normal pulses.     Heart sounds: Normal heart sounds.  Pulmonary:     Effort: Pulmonary effort is normal.     Breath sounds: Normal breath sounds.  Abdominal:     General: Bowel sounds are normal.  Musculoskeletal:        General: Normal range of motion.  Skin:    General: Skin is warm.  Neurological:     Mental Status: She is alert and oriented to person, place, and time.  Psychiatric:        Mood and Affect: Mood normal.        Behavior: Behavior normal. Behavior is cooperative.    The plan was reviewed with the patient/family, and all questions/concerned were addressed.  It was my pleasure to see Tiffany Mcintosh today and participate in her care. Please feel free to contact me with any questions or concerns.  Sincerely,  Marylene Land NP [facility]

## 2021-02-09 NOTE — Assessment & Plan Note (Signed)
Completed annual physical exam.  Education for health maintenance and preventative care provided to patient with printed handouts given.  Labs completed today-CBC, TSH, CMP, lipid panel, HIV antibody, hepatitis C antibody, Follow-up in 1 year

## 2021-02-09 NOTE — Patient Instructions (Signed)
Health Maintenance, Female Adopting a healthy lifestyle and getting preventive care are important in promoting health and wellness. Ask your health care provider about:  The right schedule for you to have regular tests and exams.  Things you can do on your own to prevent diseases and keep yourself healthy. What should I know about diet, weight, and exercise? Eat a healthy diet  Eat a diet that includes plenty of vegetables, fruits, low-fat dairy products, and lean protein.  Do not eat a lot of foods that are high in solid fats, added sugars, or sodium.   Maintain a healthy weight Body mass index (BMI) is used to identify weight problems. It estimates body fat based on height and weight. Your health care provider can help determine your BMI and help you achieve or maintain a healthy weight. Get regular exercise Get regular exercise. This is one of the most important things you can do for your health. Most adults should:  Exercise for at least 150 minutes each week. The exercise should increase your heart rate and make you sweat (moderate-intensity exercise).  Do strengthening exercises at least twice a week. This is in addition to the moderate-intensity exercise.  Spend less time sitting. Even light physical activity can be beneficial. Watch cholesterol and blood lipids Have your blood tested for lipids and cholesterol at 56 years of age, then have this test every 5 years. Have your cholesterol levels checked more often if:  Your lipid or cholesterol levels are high.  You are older than 56 years of age.  You are at high risk for heart disease. What should I know about cancer screening? Depending on your health history and family history, you may need to have cancer screening at various ages. This may include screening for:  Breast cancer.  Cervical cancer.  Colorectal cancer.  Skin cancer.  Lung cancer. What should I know about heart disease, diabetes, and high blood  pressure? Blood pressure and heart disease  High blood pressure causes heart disease and increases the risk of stroke. This is more likely to develop in people who have high blood pressure readings, are of African descent, or are overweight.  Have your blood pressure checked: ? Every 3-5 years if you are 18-39 years of age. ? Every year if you are 40 years old or older. Diabetes Have regular diabetes screenings. This checks your fasting blood sugar level. Have the screening done:  Once every three years after age 40 if you are at a normal weight and have a low risk for diabetes.  More often and at a younger age if you are overweight or have a high risk for diabetes. What should I know about preventing infection? Hepatitis B If you have a higher risk for hepatitis B, you should be screened for this virus. Talk with your health care provider to find out if you are at risk for hepatitis B infection. Hepatitis C Testing is recommended for:  Everyone born from 1945 through 1965.  Anyone with known risk factors for hepatitis C. Sexually transmitted infections (STIs)  Get screened for STIs, including gonorrhea and chlamydia, if: ? You are sexually active and are younger than 56 years of age. ? You are older than 56 years of age and your health care provider tells you that you are at risk for this type of infection. ? Your sexual activity has changed since you were last screened, and you are at increased risk for chlamydia or gonorrhea. Ask your health care provider   if you are at risk.  Ask your health care provider about whether you are at high risk for HIV. Your health care provider may recommend a prescription medicine to help prevent HIV infection. If you choose to take medicine to prevent HIV, you should first get tested for HIV. You should then be tested every 3 months for as long as you are taking the medicine. Pregnancy  If you are about to stop having your period (premenopausal) and  you may become pregnant, seek counseling before you get pregnant.  Take 400 to 800 micrograms (mcg) of folic acid every day if you become pregnant.  Ask for birth control (contraception) if you want to prevent pregnancy. Osteoporosis and menopause Osteoporosis is a disease in which the bones lose minerals and strength with aging. This can result in bone fractures. If you are 65 years old or older, or if you are at risk for osteoporosis and fractures, ask your health care provider if you should:  Be screened for bone loss.  Take a calcium or vitamin D supplement to lower your risk of fractures.  Be given hormone replacement therapy (HRT) to treat symptoms of menopause. Follow these instructions at home: Lifestyle  Do not use any products that contain nicotine or tobacco, such as cigarettes, e-cigarettes, and chewing tobacco. If you need help quitting, ask your health care provider.  Do not use street drugs.  Do not share needles.  Ask your health care provider for help if you need support or information about quitting drugs. Alcohol use  Do not drink alcohol if: ? Your health care provider tells you not to drink. ? You are pregnant, may be pregnant, or are planning to become pregnant.  If you drink alcohol: ? Limit how much you use to 0-1 drink a day. ? Limit intake if you are breastfeeding.  Be aware of how much alcohol is in your drink. In the [place] one drink equals one 12 oz bottle of beer (355 mL), one 5 oz glass of wine (148 mL), or one 1 oz glass of hard liquor (44 mL). General instructions  Schedule regular health, dental, and eye exams.  Stay current with your vaccines.  Tell your health care provider if: ? You often feel depressed. ? You have ever been abused or do not feel safe at home. Summary  Adopting a healthy lifestyle and getting preventive care are important in promoting health and wellness.  Follow your health care provider's instructions about healthy  diet, exercising, and getting tested or screened for diseases.  Follow your health care provider's instructions on monitoring your cholesterol and blood pressure. This information is not intended to replace advice given to you by your health care provider. Make sure you discuss any questions you have with your health care provider. Document Revised: 10/23/2018 Document Reviewed: 10/23/2018 Elsevier Patient Education  2021 [employer]  

## 2021-02-10 ENCOUNTER — Other Ambulatory Visit: Payer: BC Managed Care – PPO

## 2021-02-10 DIAGNOSIS — Z136 Encounter for screening for cardiovascular disorders: Secondary | ICD-10-CM | POA: Diagnosis not present

## 2021-02-10 DIAGNOSIS — M461 Sacroiliitis, not elsewhere classified: Secondary | ICD-10-CM | POA: Diagnosis not present

## 2021-02-10 DIAGNOSIS — M542 Cervicalgia: Secondary | ICD-10-CM | POA: Diagnosis not present

## 2021-02-10 DIAGNOSIS — M546 Pain in thoracic spine: Secondary | ICD-10-CM | POA: Diagnosis not present

## 2021-02-10 DIAGNOSIS — M545 Low back pain, unspecified: Secondary | ICD-10-CM | POA: Diagnosis not present

## 2021-02-10 DIAGNOSIS — Z0000 Encounter for general adult medical examination without abnormal findings: Secondary | ICD-10-CM

## 2021-02-11 LAB — CBC WITH DIFFERENTIAL/PLATELET
Basophils Absolute: 0 10*3/uL (ref 0.0–0.2)
Basos: 1 %
EOS (ABSOLUTE): 0.2 10*3/uL (ref 0.0–0.4)
Eos: 2 %
Hematocrit: 43.7 % (ref 34.0–46.6)
Hemoglobin: 15.1 g/dL (ref 11.1–15.9)
Immature Grans (Abs): 0 10*3/uL (ref 0.0–0.1)
Immature Granulocytes: 0 %
Lymphocytes Absolute: 2.2 10*3/uL (ref 0.7–3.1)
Lymphs: 35 %
MCH: 32.6 pg (ref 26.6–33.0)
MCHC: 34.6 g/dL (ref 31.5–35.7)
MCV: 94 fL (ref 79–97)
Monocytes Absolute: 0.7 10*3/uL (ref 0.1–0.9)
Monocytes: 11 %
Neutrophils Absolute: 3.1 10*3/uL (ref 1.4–7.0)
Neutrophils: 51 %
Platelets: 212 10*3/uL (ref 150–450)
RBC: 4.63 x10E6/uL (ref 3.77–5.28)
RDW: 12.5 % (ref 11.7–15.4)
WBC: 6.2 10*3/uL (ref 3.4–10.8)

## 2021-02-11 LAB — COMPREHENSIVE METABOLIC PANEL
ALT: 93 IU/L — ABNORMAL HIGH (ref 0–32)
AST: 62 IU/L — ABNORMAL HIGH (ref 0–40)
Albumin/Globulin Ratio: 1.8 (ref 1.2–2.2)
Albumin: 4.4 g/dL (ref 3.8–4.9)
Alkaline Phosphatase: 58 IU/L (ref 44–121)
BUN/Creatinine Ratio: 10 (ref 9–23)
BUN: 11 mg/dL (ref 6–24)
Bilirubin Total: 0.4 mg/dL (ref 0.0–1.2)
CO2: 25 mmol/L (ref 20–29)
Calcium: 9.5 mg/dL (ref 8.7–10.2)
Chloride: 100 mmol/L (ref 96–106)
Creatinine, Ser: 1.13 mg/dL — ABNORMAL HIGH (ref 0.57–1.00)
Globulin, Total: 2.4 g/dL (ref 1.5–4.5)
Glucose: 106 mg/dL — ABNORMAL HIGH (ref 65–99)
Potassium: 4.4 mmol/L (ref 3.5–5.2)
Sodium: 142 mmol/L (ref 134–144)
Total Protein: 6.8 g/dL (ref 6.0–8.5)
eGFR: 57 mL/min/{1.73_m2} — ABNORMAL LOW (ref >59)

## 2021-02-11 LAB — LIPID PANEL
Chol/HDL Ratio: 4.4 ratio (ref 0.0–4.4)
Cholesterol, Total: 261 mg/dL — ABNORMAL HIGH (ref 100–199)
HDL: 60 mg/dL (ref >39)
LDL Chol Calc (NIH): 166 mg/dL — ABNORMAL HIGH (ref 0–99)
Triglycerides: 193 mg/dL — ABNORMAL HIGH (ref 0–149)
VLDL Cholesterol Cal: 35 mg/dL (ref 5–40)

## 2021-02-11 LAB — HEPATITIS C ANTIBODY: Hep C Virus Ab: <0.1 s/co ratio (ref 0.0–0.9)

## 2021-02-11 LAB — HIV ANTIBODY (ROUTINE TESTING W REFLEX): HIV Screen 4th Generation wRfx: NONREACTIVE

## 2021-02-11 LAB — TSH: TSH: 3.3 u[IU]/mL (ref 0.450–4.500)

## 2021-02-21 ENCOUNTER — Ambulatory Visit: Payer: BC Managed Care – PPO | Admitting: Nurse Practitioner

## 2021-02-21 ENCOUNTER — Other Ambulatory Visit: Payer: Self-pay

## 2021-02-21 VITALS — BP 108/60 | HR 78 | Temp 97.8°F | Ht 67.0 in | Wt 207.0 lb

## 2021-02-21 DIAGNOSIS — Z712 Person consulting for explanation of examination or test findings: Secondary | ICD-10-CM

## 2021-02-21 NOTE — Assessment & Plan Note (Signed)
Patient in clinic today concerned about her cholesterol levels and TSH, ALT and AST values with slightly elevated glucose.  Spent time educating patient on the differences elevation in lab results can make.  Advised patient that at this time it will be beneficial to make changes to diet by choosing low-cholesterol diet, exercise and weight loss and repeat lipid panels in 3 months.  For ALT and AST elevation to reduce any form of Tylenol, ibuprofen and alcohol consumption.  Advised patient that TSH is within normal limits.  And slight elevation in glucose level from 99-1 01 is not a cause for alarm but may be due to [facility] prior to labs the next morning and will be checked for prediabetes or diabetes if glucose fasting was above 120.  Patient verbalized understanding and will return in 3 months for repeat lipids and labs.

## 2021-02-21 NOTE — Progress Notes (Signed)
Established Patient Office Visit  Subjective:  Patient ID: Tiffany Mcintosh, female    DOB: 01-08-1966  Age: 55 y.o. MRN: [id number]  CC:  Chief Complaint  Patient presents with  . Medical Management of Chronic Issues    HPI Tiffany Mcintosh presents for follow-up of lab results.  Patient is reporting displeasure after seeing her lab results from 02/11/2019 patient was not comfortable with her results and wanted to make sure that she was ahead of things.  Past Medical History:  Diagnosis Date  . Anxiety   . Arthritis   . Endometriosis   . Sleep apnea, obstructive     Past Surgical History:  Procedure Laterality Date  . ABDOMINAL HYSTERECTOMY    . APPENDECTOMY    . CHOLECYSTECTOMY    . tendon repair      Family History  Problem Relation Age of Onset  . Hyperlipidemia Mother   . Hypertension Mother   . COPD Mother   . Depression Mother   . Anxiety disorder Mother   . Alcohol abuse Father     Social History   Socioeconomic History  . Marital status: Significant Other    Spouse name: Not on file  . Number of children: Not on file  . Years of education: Not on file  . Highest education level: Not on file  Occupational History  . Not on file  Tobacco Use  . Smoking status: Former Smoker    Packs/day: 0.50    Years: 0.50    Pack years: 0.25    Types: Cigarettes    Quit date: 12/12/2020    Years since quitting: 0.1  . Smokeless tobacco: Never Used  Vaping Use  . Vaping Use: Every day  . Substances: Nicotine  Substance and Sexual Activity  . Alcohol use: Yes    Comment: socially  . Drug use: Not Currently  . Sexual activity: Yes    Birth control/protection: Surgical  Other Topics Concern  . Not on file  Social History Narrative  . Not on file   Social Determinants of Health   Financial Resource Strain: Not on file  Food Insecurity: Not on file  Transportation Needs: Not on file  Physical Activity: Not on file  Stress: Not on file  Social Connections:  Not on file  Intimate Partner Violence: Not on file    Outpatient Medications Prior to Visit  Medication Sig Dispense Refill  . estradiol (VIVELLE-DOT) 0.05 MG/24HR patch Place 1 patch onto the skin 2 (two) times a week.    . Famotidine (PEPCID AC PO) Take by mouth.    . fexofenadine (ALLEGRA) 180 MG tablet Take 180 mg by mouth daily.     No facility-administered medications prior to visit.    Allergies  Allergen Reactions  . Sulfa Antibiotics Anaphylaxis    ROS Review of Systems  Constitutional: Negative.   HENT: Negative.   Respiratory: Negative.   Cardiovascular: Negative.   Gastrointestinal: Negative.   Genitourinary: Negative.   All other systems reviewed and are negative.     Objective:    Physical Exam Vitals reviewed.  Constitutional:      Appearance: Normal appearance.  HENT:     Head: Normocephalic.     Nose: Nose normal.  Eyes:     Conjunctiva/sclera: Conjunctivae normal.  Cardiovascular:     Rate and Rhythm: Regular rhythm.     Pulses: Normal pulses.  Pulmonary:     Effort: Pulmonary effort is normal.     Breath sounds: Normal  breath sounds.  Abdominal:     General: Bowel sounds are normal.  Neurological:     Mental Status: She is alert.     BP 108/60   Pulse 78   Temp 97.8 F (36.6 C) (Temporal)   Ht 5\' 7"  (1.702 m)   Wt 207 lb (93.9 kg)   SpO2 99%   BMI 32.42 kg/m  Wt Readings from Last 3 Encounters:  02/21/21 207 lb (93.9 kg)  02/09/21 207 lb (93.9 kg)  09/14/20 190 lb (86.2 kg)     Health Maintenance Due  Topic Date Due  . PAP SMEAR-Modifier  Never done  . COLONOSCOPY (Pts 45-61yrs Insurance coverage will need to be confirmed)  Never done  . MAMMOGRAM  Never done    There are no preventive care reminders to display for this patient.  Lab Results  Component Value Date   TSH 3.300 02/10/2021   Lab Results  Component Value Date   WBC 6.2 02/10/2021   HGB 15.1 02/10/2021   HCT 43.7 02/10/2021   MCV 94 02/10/2021    PLT 212 02/10/2021   Lab Results  Component Value Date   NA 142 02/10/2021   K 4.4 02/10/2021   CO2 25 02/10/2021   GLUCOSE 106 (H) 02/10/2021   BUN 11 02/10/2021   CREATININE 1.13 (H) 02/10/2021   BILITOT 0.4 02/10/2021   ALKPHOS 58 02/10/2021   AST 62 (H) 02/10/2021   ALT 93 (H) 02/10/2021   PROT 6.8 02/10/2021   ALBUMIN 4.4 02/10/2021   CALCIUM 9.5 02/10/2021   ANIONGAP 8 09/14/2020   Lab Results  Component Value Date   CHOL 261 (H) 02/10/2021   Lab Results  Component Value Date   HDL 60 02/10/2021   Lab Results  Component Value Date   LDLCALC 166 (H) 02/10/2021   Lab Results  Component Value Date   TRIG 193 (H) 02/10/2021   Lab Results  Component Value Date   CHOLHDL 4.4 02/10/2021   No results found for: HGBA1C    Assessment & Plan:   Problem List Items Addressed This Visit      Other   Encounter to discuss test results - Primary    Patient in clinic today concerned about her cholesterol levels and TSH, ALT and AST values with slightly elevated glucose.  Spent time educating patient on the differences elevation in lab results can make.  Advised patient that at this time it will be beneficial to make changes to diet by choosing low-cholesterol diet, exercise and weight loss and repeat lipid panels in 3 months.  For ALT and AST elevation to reduce any form of Tylenol, ibuprofen and alcohol consumption.  Advised patient that TSH is within normal limits.  And slight elevation in glucose level from 99-1 01 is not a cause for alarm but may be due to [facility] prior to labs the next morning and will be checked for prediabetes or diabetes if glucose fasting was above 120.  Patient verbalized understanding and will return in 3 months for repeat lipids and labs.         No orders of the defined types were placed in this encounter.   Follow-up: No follow-ups on file.    [facility], NP

## 2021-02-28 DIAGNOSIS — M461 Sacroiliitis, not elsewhere classified: Secondary | ICD-10-CM | POA: Diagnosis not present

## 2021-02-28 DIAGNOSIS — M546 Pain in thoracic spine: Secondary | ICD-10-CM | POA: Diagnosis not present

## 2021-02-28 DIAGNOSIS — M545 Low back pain, unspecified: Secondary | ICD-10-CM | POA: Diagnosis not present

## 2021-02-28 DIAGNOSIS — M542 Cervicalgia: Secondary | ICD-10-CM | POA: Diagnosis not present

## 2021-03-07 DIAGNOSIS — M545 Low back pain, unspecified: Secondary | ICD-10-CM | POA: Diagnosis not present

## 2021-03-07 DIAGNOSIS — M461 Sacroiliitis, not elsewhere classified: Secondary | ICD-10-CM | POA: Diagnosis not present

## 2021-03-07 DIAGNOSIS — M546 Pain in thoracic spine: Secondary | ICD-10-CM | POA: Diagnosis not present

## 2021-03-07 DIAGNOSIS — M542 Cervicalgia: Secondary | ICD-10-CM | POA: Diagnosis not present

## 2021-03-14 DIAGNOSIS — S93402A Sprain of unspecified ligament of left ankle, initial encounter: Secondary | ICD-10-CM | POA: Diagnosis not present

## 2021-04-06 DIAGNOSIS — M545 Low back pain, unspecified: Secondary | ICD-10-CM | POA: Diagnosis not present

## 2021-04-06 DIAGNOSIS — M542 Cervicalgia: Secondary | ICD-10-CM | POA: Diagnosis not present

## 2021-04-06 DIAGNOSIS — M546 Pain in thoracic spine: Secondary | ICD-10-CM | POA: Diagnosis not present

## 2021-04-06 DIAGNOSIS — M461 Sacroiliitis, not elsewhere classified: Secondary | ICD-10-CM | POA: Diagnosis not present

## 2021-04-12 ENCOUNTER — Encounter: Payer: Self-pay | Admitting: Family Medicine

## 2021-04-12 ENCOUNTER — Ambulatory Visit: Payer: BC Managed Care – PPO | Admitting: Family Medicine

## 2021-04-12 DIAGNOSIS — R509 Fever, unspecified: Secondary | ICD-10-CM

## 2021-04-12 DIAGNOSIS — J069 Acute upper respiratory infection, unspecified: Secondary | ICD-10-CM | POA: Diagnosis not present

## 2021-04-12 LAB — VERITOR FLU A/B WAIVED
Influenza A: NEGATIVE
Influenza B: NEGATIVE

## 2021-04-12 NOTE — Progress Notes (Signed)
   Virtual Visit via Telephone Note  I connected with Tiffany Mcintosh on 04/12/21 at 11:00 AM by telephone and verified that I am speaking with the correct person using two identifiers. Tiffany Mcintosh is currently located in her vehicle and nobody is currently with her during this visit. The provider, Tiffany Fudge, FNP is located in their office at time of visit.  I discussed the limitations, risks, security and privacy concerns of performing an evaluation and management service by telephone and the availability of in person appointments. I also discussed with the patient that there may be a patient responsible charge related to this service. The patient expressed understanding and agreed to proceed.  Subjective: PCP: Tiffany Drown, NP  Chief Complaint  Patient presents with  . URI   Patient complains of headache, fever and body aches. Additional symptoms include cough, head congestion, runny nose and postnasal drainage. Onset of symptoms was 1 day ago, unchanged since that time. She is drinking plenty of fluids. Evaluation to date: none. Treatment to date: Tylenol and ibuprofen. She does vape.    ROS: Per HPI  Current Outpatient Medications:  .  estradiol (VIVELLE-DOT) 0.05 MG/24HR patch, Place 1 patch onto the skin 2 (two) times a week., Disp: , Rfl:  .  Famotidine (PEPCID AC PO), Take by mouth., Disp: , Rfl:  .  fexofenadine (ALLEGRA) 180 MG tablet, Take 180 mg by mouth daily., Disp: , Rfl:   Allergies  Allergen Reactions  . Sulfa Antibiotics Anaphylaxis   Past Medical History:  Diagnosis Date  . Anxiety   . Arthritis   . Endometriosis   . Sleep apnea, obstructive     Observations/Objective: A&O  No respiratory distress or wheezing audible over the phone Mood, judgement, and thought processes all WNL  Assessment and Plan: 1. Viral URI Discussed symptom management.  2. Fever, unspecified fever cause - Veritor Flu A/B Waived; Future - Novel Coronavirus, NAA  ([facility]); Future   Follow Up Instructions:  I discussed the assessment and treatment plan with the patient. The patient was provided an opportunity to ask questions and all were answered. The patient agreed with the plan and demonstrated an understanding of the instructions.   The patient was advised to call back or seek an in-person evaluation if the symptoms worsen or if the condition fails to improve as anticipated.  The above assessment and management plan was discussed with the patient. The patient verbalized understanding of and has agreed to the management plan. Patient is aware to call the clinic if symptoms persist or worsen. Patient is aware when to return to the clinic for a follow-up visit. Patient educated on when it is appropriate to go to the emergency department.   Time call ended: 11:11 AM  I provided 11 minutes of non-face-to-face time during this encounter.  Tiffany Boston, MSN, APRN, FNP-C [facility] 04/12/21

## 2021-04-13 LAB — NOVEL CORONAVIRUS, NAA: SARS-CoV-2, NAA: DETECTED — AB

## 2021-04-13 LAB — SARS-COV-2, NAA 2 DAY TAT

## 2021-05-06 ENCOUNTER — Telehealth: Payer: Self-pay | Admitting: *Deleted

## 2021-05-06 ENCOUNTER — Encounter: Payer: Self-pay | Admitting: Family Medicine

## 2021-05-06 ENCOUNTER — Other Ambulatory Visit: Payer: Self-pay

## 2021-05-06 ENCOUNTER — Ambulatory Visit: Payer: BC Managed Care – PPO | Admitting: Family Medicine

## 2021-05-06 ENCOUNTER — Telehealth: Payer: Self-pay | Admitting: Nurse Practitioner

## 2021-05-06 VITALS — BP 109/65 | HR 79 | Temp 97.5°F | Ht 67.0 in | Wt 208.4 lb

## 2021-05-06 DIAGNOSIS — K645 Perianal venous thrombosis: Secondary | ICD-10-CM | POA: Diagnosis not present

## 2021-05-06 MED ORDER — HYDROCORTISONE (PERIANAL) 2.5 % EX CREA: 1.0000 "application " | TOPICAL_CREAM | Freq: Two times a day (BID) | CUTANEOUS | 2 refills | Status: AC

## 2021-05-06 MED ORDER — HYDROCORTISONE ACE-PRAMOXINE 1-1 % EX CREA: 1.0000 "application " | TOPICAL_CREAM | Freq: Two times a day (BID) | CUTANEOUS | 0 refills | Status: DC

## 2021-05-06 NOTE — Addendum Note (Signed)
Addended by: Mechele Claude on: 05/06/2021 02:09 PM   Modules accepted: Orders

## 2021-05-06 NOTE — Telephone Encounter (Signed)
TC from [facility] pharmacy Proctocream is only available in 2.5% they other has been discontinued The Proctofoam is available in the 1-1% Please advise

## 2021-05-06 NOTE — Telephone Encounter (Signed)
Please let the patient know that I sent their prescription to their pharmacy. Thanks, WS 

## 2021-05-06 NOTE — Patient Instructions (Signed)

## 2021-05-06 NOTE — Progress Notes (Addendum)
Chief Complaint  Patient presents with   Hemorrhoids    HPI  Patient presents today for onset 6 days ago of rectal pain.  She was able to feel a hemorrhoid.  Pain was intense for 3 days.  It let up for couple of days.  Then yesterday she started bleeding and she continues to have a lesser amount of pain.  Bleeding continues until now.  She is sure its not vaginal bleeding.  Her last period was several years ago.  PMH: Smoking status noted ROS: Per HPI  Objective: BP 109/65   Pulse 79   Temp (!) 97.5 F (36.4 C)   Ht 5\' 7"  (1.702 m)   Wt 208 lb 6.4 oz (94.5 kg)   SpO2 98%   BMI 32.64 kg/m  Gen: NAD, alert, cooperative with exam BS present, no guarding or organomegaly.  1 cm thrombosed external hemorrhoid noted at the left lower anal verge.  It is noted to have some dried blood and clotting on the surface.  Palpation reveals exquisite tenderness.  The area was prepped with Betadine and local performed with ethyl chloride.  Lesion was lanced with 11 blade.  Subsequently a large thrombosis was extruded.  Patient tolerated the procedure well although it was uncomfortable.  Minimal bleeding.  Pad applied. Ext: No edema, warm Neuro: Alert and oriented, No gross deficits  Assessment and plan:  1. Thrombosed external hemorrhoid     Meds ordered this encounter  Medications   DISCONTD: pramoxine-hydrocortisone (PROCTOCREAM-HC) 1-1 % rectal cream    Sig: Place 1 application rectally 2 (two) times daily.    Dispense:  30 g    Refill:  0   hydrocortisone (ANUSOL-HC) 2.5 % rectal cream    Sig: Place 1 application rectally 2 (two) times daily.    Dispense:  30 g    Refill:  2    Wound care was reviewed.  Educational handout included in AVS given to patient   Follow up as needed.  , MD

## 2021-05-06 NOTE — Telephone Encounter (Signed)
Pt aware, they first script was not available in the strength that was written, new script was sent in after lunch today

## 2021-05-20 ENCOUNTER — Encounter: Payer: Self-pay | Admitting: Nurse Practitioner

## 2021-05-20 ENCOUNTER — Other Ambulatory Visit: Payer: Self-pay

## 2021-05-20 ENCOUNTER — Ambulatory Visit: Payer: BC Managed Care – PPO | Admitting: Nurse Practitioner

## 2021-05-20 VITALS — BP 103/65 | HR 64 | Temp 97.5°F | Ht 67.0 in | Wt 211.0 lb

## 2021-05-20 DIAGNOSIS — R7309 Other abnormal glucose: Secondary | ICD-10-CM | POA: Diagnosis not present

## 2021-05-20 DIAGNOSIS — E7800 Pure hypercholesterolemia, unspecified: Secondary | ICD-10-CM | POA: Diagnosis not present

## 2021-05-20 LAB — COMPREHENSIVE METABOLIC PANEL
ALT: 215 IU/L — ABNORMAL HIGH (ref 0–32)
AST: 105 IU/L — ABNORMAL HIGH (ref 0–40)
Albumin/Globulin Ratio: 1.8 (ref 1.2–2.2)
Albumin: 4.2 g/dL (ref 3.8–4.9)
Alkaline Phosphatase: 59 IU/L (ref 44–121)
BUN/Creatinine Ratio: 7 — ABNORMAL LOW (ref 9–23)
BUN: 7 mg/dL (ref 6–24)
Bilirubin Total: 0.4 mg/dL (ref 0.0–1.2)
CO2: 25 mmol/L (ref 20–29)
Calcium: 9.3 mg/dL (ref 8.7–10.2)
Chloride: 102 mmol/L (ref 96–106)
Creatinine, Ser: 0.96 mg/dL (ref 0.57–1.00)
Globulin, Total: 2.3 g/dL (ref 1.5–4.5)
Glucose: 99 mg/dL (ref 65–99)
Potassium: 4.5 mmol/L (ref 3.5–5.2)
Sodium: 138 mmol/L (ref 134–144)
Total Protein: 6.5 g/dL (ref 6.0–8.5)
eGFR: 70 mL/min/{1.73_m2} (ref >59)

## 2021-05-20 LAB — LIPID PANEL
Chol/HDL Ratio: 4.2 ratio (ref 0.0–4.4)
Cholesterol, Total: 241 mg/dL — ABNORMAL HIGH (ref 100–199)
HDL: 58 mg/dL (ref >39)
LDL Chol Calc (NIH): 151 mg/dL — ABNORMAL HIGH (ref 0–99)
Triglycerides: 178 mg/dL — ABNORMAL HIGH (ref 0–149)
VLDL Cholesterol Cal: 32 mg/dL (ref 5–40)

## 2021-05-20 MED ORDER — ESTRADIOL 0.05 MG/24HR TD PTTW: 1.0000 | MEDICATED_PATCH | TRANSDERMAL | 2 refills | Status: DC

## 2021-05-20 NOTE — Assessment & Plan Note (Signed)
Repeat labs to reassess elevated blood glucose, CMP results pending.  Education provided printed handouts given

## 2021-05-20 NOTE — Patient Instructions (Signed)
Calorie Counting for Weight Loss Calories are units of energy. Your body needs a certain number of calories from food to keep going throughout the day. When you eat or drink more calories than your body needs, your body stores the extra calories mostly as fat. When you eat or drink fewer calories than your body needs, your body burns fat to getthe energy it needs. Calorie counting means keeping track of how many calories you eat and drink each day. Calorie counting can be helpful if you need to lose weight. If you eat fewer calories than your body needs, you should lose weight. Ask yourhealth care provider what a healthy weight is for you. For calorie counting to work, you will need to eat the right number of calories each day to lose a healthy amount of weight per week. A dietitian can help you figure out how many calories you need in a day and will suggest ways to reach your calorie goal. A healthy amount of weight to lose each week is usually 1-2 lb (0.5-0.9 kg). This usually means that your daily calorie intake should be reduced by 500-750 calories. Eating 1,200-1,500 calories a day can help most women lose weight. Eating 1,500-1,800 calories a day can help most men lose weight. What do I need to know about calorie counting? Work with your health care provider or dietitian to determine how many calories you should get each day. To meet your daily calorie goal, you will need to: Find out how many calories are in each food that you would like to eat. Try to do this before you eat. Decide how much of the food you plan to eat. Keep a food log. Do this by writing down what you ate and how many calories it had. To successfully lose weight, it is important to balance calorie counting with ahealthy lifestyle that includes regular activity. Where do I find calorie information?  The number of calories in a food can be found on a Nutrition Facts label. If a food does not have a Nutrition Facts label, try to  look up the calories onlineor ask your dietitian for help. Remember that calories are listed per serving. If you choose to have more than one serving of a food, you will have to multiply the calories per serving by the number of servings you plan to eat. For example, the label on a package of bread might say that a serving size is 1 slice and that there are 90 calories in a serving. If you eat 1 slice, you will have eaten 90 calories. If you eat 2slices, you will have eaten 180 calories. How do I keep a food log? After each time that you eat, record the following in your food log as soon as possible: What you ate. Be sure to include toppings, sauces, and other extras on the food. How much you ate. This can be measured in cups, ounces, or number of items. How many calories were in each food and drink. The total number of calories in the food you ate. Keep your food log near you, such as in a pocket-sized notebook or on an app or website on your mobile phone. Some programs will calculate calories for you andshow you how many calories you have left to meet your daily goal. What are some portion-control tips? Know how many calories are in a serving. This will help you know how many servings you can have of a certain food. Use a measuring cup to   measure serving sizes. You could also try weighing out portions on a kitchen scale. With time, you will be able to estimate serving sizes for some foods. Take time to put servings of different foods on your favorite plates or in your favorite bowls and cups so you know what a serving looks like. Try not to eat straight from a food's packaging, such as from a bag or box. Eating straight from the package makes it hard to see how much you are eating and can lead to overeating. Put the amount you would like to eat in a cup or on a plate to make sure you are eating the right portion. Use smaller plates, glasses, and bowls for smaller portions and to prevent  overeating. Try not to multitask. For example, avoid watching TV or using your computer while eating. If it is time to eat, sit down at a table and enjoy your food. This will help you recognize when you are full. It will also help you be more mindful of what and how much you are eating. What are tips for following this plan? Reading food labels Check the calorie count compared with the serving size. The serving size may be smaller than what you are used to eating. Check the source of the calories. Try to choose foods that are high in protein, fiber, and vitamins, and low in saturated fat, trans fat, and sodium. Shopping Read nutrition labels while you shop. This will help you make healthy decisions about which foods to buy. Pay attention to nutrition labels for low-fat or fat-free foods. These foods sometimes have the same number of calories or more calories than the full-fat versions. They also often have added sugar, starch, or salt to make up for flavor that was removed with the fat. Make a grocery list of lower-calorie foods and stick to it. Cooking Try to cook your favorite foods in a healthier way. For example, try baking instead of frying. Use low-fat dairy products. Meal planning Use more fruits and vegetables. One-half of your plate should be fruits and vegetables. Include lean proteins, such as chicken, turkey, and fish. Lifestyle Each week, aim to do one of the following: 150 minutes of moderate exercise, such as walking. 75 minutes of vigorous exercise, such as running. General information Know how many calories are in the foods you eat most often. This will help you calculate calorie counts faster. Find a way of tracking calories that works for you. Get creative. Try different apps or programs if writing down calories does not work for you. What foods should I eat?  Eat nutritious foods. It is better to have a nutritious, high-calorie food, such as an avocado, than a food with  few nutrients, such as a bag of potato chips. Use your calories on foods and drinks that will fill you up and will not leave you hungry soon after eating. Examples of foods that fill you up are nuts and nut butters, vegetables, lean proteins, and high-fiber foods such as whole grains. High-fiber foods are foods with more than 5 g of fiber per serving. Pay attention to calories in drinks. Low-calorie drinks include water and unsweetened drinks. The items listed above may not be a complete list of foods and beverages you can eat. Contact a dietitian for more information. What foods should I limit? Limit foods or drinks that are not good sources of vitamins, minerals, or protein or that are high in unhealthy fats. These include: Candy. Other sweets. Sodas, specialty   coffee drinks, alcohol, and juice. The items listed above may not be a complete list of foods and beverages you should avoid. Contact a dietitian for more information. How do I count calories when eating out? Pay attention to portions. Often, portions are much larger when eating out. Try these tips to keep portions smaller: Consider sharing a meal instead of getting your own. If you get your own meal, eat only half of it. Before you start eating, ask for a container and put half of your meal into it. When available, consider ordering smaller portions from the menu instead of full portions. Pay attention to your food and drink choices. Knowing the way food is cooked and what is included with the meal can help you eat fewer calories. If calories are listed on the menu, choose the lower-calorie options. Choose dishes that include vegetables, fruits, whole grains, low-fat dairy products, and lean proteins. Choose items that are boiled, broiled, grilled, or steamed. Avoid items that are buttered, battered, fried, or served with cream sauce. Items labeled as crispy are usually fried, unless stated otherwise. Choose water, low-fat milk,  unsweetened iced tea, or other drinks without added sugar. If you want an alcoholic beverage, choose a lower-calorie option, such as a glass of wine or light beer. Ask for dressings, sauces, and syrups on the side. These are usually high in calories, so you should limit the amount you eat. If you want a salad, choose a garden salad and ask for grilled meats. Avoid extra toppings such as bacon, cheese, or fried items. Ask for the dressing on the side, or ask for olive oil and vinegar or lemon to use as dressing. Estimate how many servings of a food you are given. Knowing serving sizes will help you be aware of how much food you are eating at restaurants. Where to find more information Centers for Disease Control and Prevention: FootballExhibition.com.br U.[place]. Department of Agriculture: WrestlingReporter.dk Summary Calorie counting means keeping track of how many calories you eat and drink each day. If you eat fewer calories than your body needs, you should lose weight. A healthy amount of weight to lose per week is usually 1-2 lb (0.5-0.9 kg). This usually means reducing your daily calorie intake by 500-750 calories. The number of calories in a food can be found on a Nutrition Facts label. If a food does not have a Nutrition Facts label, try to look up the calories online or ask your dietitian for help. Use smaller plates, glasses, and bowls for smaller portions and to prevent overeating. Use your calories on foods and drinks that will fill you up and not leave you hungry shortly after a meal. This information is not intended to replace advice given to you by your health care provider. Make sure you discuss any questions you have with your healthcare provider. Document Revised: 12/11/2019 Document Reviewed: 12/11/2019 Elsevier Patient Education  2022 Else[employer] Inc. Cholesterol Content in Foods Cholesterol is a waxy, fat-like substance that helps to carry fat in the blood. The body needs cholesterol in small amounts, but too  much cholesterol can causedamage to the arteries and heart. Most people should eat less than 200 milligrams (mg) of cholesterol a day. Foods with cholesterol  Cholesterol is found in animal-based foods, such as meat, seafood, and dairy. Generally, low-fat dairy and lean meats have less cholesterol than full-fat dairy and fatty meats. The milligrams of cholesterol per serving (mg per serving) of common cholesterol-containing foods are listed below. Meat and other proteins  Egg -- one large whole egg has 186 mg. Veal shank -- 4 oz has 141 mg. Lean ground [place] (93% lean) -- 4 oz has 118 mg. Fat-trimmed lamb loin -- 4 oz has 106 mg. Lean ground beef (90% lean) -- 4 oz has 100 mg. Lobster -- 3.5 oz has 90 mg. Pork loin chops -- 4 oz has 86 mg. Canned salmon -- 3.5 oz has 83 mg. Fat-trimmed beef top loin -- 4 oz has 78 mg. Frankfurter -- 1 frank (3.5 oz) has 77 mg. Crab -- 3.5 oz has 71 mg. Roasted chicken without skin, white meat -- 4 oz has 66 mg. Light bologna -- 2 oz has 45 mg. Deli-cut [place] -- 2 oz has 31 mg. Canned tuna -- 3.5 oz has 31 mg. Tomasa Blase -- 1 oz has 29 mg. Oysters and mussels (raw) -- 3.5 oz has 25 mg. Mackerel -- 1 oz has 22 mg. Trout -- 1 oz has 20 mg. Pork sausage -- 1 link (1 oz) has 17 mg. Salmon -- 1 oz has 16 mg. Tilapia -- 1 oz has 14 mg. Dairy Soft-serve ice cream --  cup (4 oz) has 103 mg. Whole-milk yogurt -- 1 cup (8 oz) has 29 mg. Cheddar cheese -- 1 oz has 28 mg. American cheese -- 1 oz has 28 mg. Whole milk -- 1 cup (8 oz) has 23 mg. 2% milk -- 1 cup (8 oz) has 18 mg. Cream cheese -- 1 tablespoon (Tbsp) has 15 mg. Cottage cheese --  cup (4 oz) has 14 mg. Low-fat (1%) milk -- 1 cup (8 oz) has 10 mg. Sour cream -- 1 Tbsp has 8.5 mg. Low-fat yogurt -- 1 cup (8 oz) has 8 mg. Nonfat Greek yogurt -- 1 cup (8 oz) has 7 mg. Half-and-half cream -- 1 Tbsp has 5 mg. Fats and oils Cod liver oil -- 1 tablespoon (Tbsp) has 82 mg. Butter -- 1 Tbsp has 15  mg. Lard -- 1 Tbsp has 14 mg. Bacon grease -- 1 Tbsp has 14 mg. Mayonnaise -- 1 Tbsp has 5-10 mg. Margarine -- 1 Tbsp has 3-10 mg. Exact amounts of cholesterol in these foods may vary depending on specificingredients and brands. Foods without cholesterol Most plant-based foods do not have cholesterol unless you combine them with a food that has cholesterol. Foods without cholesterol include: Grains and cereals. Vegetables. Fruits. Vegetable oils, such as olive, canola, and sunflower oil. Legumes, such as peas, beans, and lentils. Nuts and seeds. Egg whites. Summary The body needs cholesterol in small amounts, but too much cholesterol can cause damage to the arteries and heart. Most people should eat less than 200 milligrams (mg) of cholesterol a day. This information is not intended to replace advice given to you by your health care provider. Make sure you discuss any questions you have with your healthcare provider. Document Revised: 02/10/2020 Document Reviewed: 03/22/2020 Elsevier Patient Education  2022 [employer].

## 2021-05-20 NOTE — Assessment & Plan Note (Signed)
Labs completed lipid panel results pending.  Continue low-cholesterol diet exercise and weight loss.  Education provided printed handouts given.

## 2021-05-20 NOTE — Progress Notes (Signed)
Established Patient Office Visit  Subjective:  Patient ID: Tiffany Mcintosh, female    DOB: 06-01-1966  Age: 55 y.o. MRN: [id number]  CC:  Chief Complaint  Patient presents with   Medical Management of Chronic Issues    HPI Tiffany Mcintosh presents for elevated mixed hyperlipidemia  Pt presents with hyperlipidemia. Patient was diagnosed in March 2022 compliance with treatment has been good which included low-cholesterol diet and exercise, follows up as directed , and maintains an exercise regimen . The patient denies experiencing any hypercholesterolemia related symptoms.    Concerning patient's elevated glucose: Last labs showed elevated blood glucose of 106 advised patient to maintain low calorie diet and exercise.  Patient is following up today to recheck blood sugars.  Past Medical History:  Diagnosis Date   Anxiety    Arthritis    Endometriosis    Sleep apnea, obstructive     Past Surgical History:  Procedure Laterality Date   ABDOMINAL HYSTERECTOMY     APPENDECTOMY     CHOLECYSTECTOMY     tendon repair      Family History  Problem Relation Age of Onset   Hyperlipidemia Mother    Hypertension Mother    COPD Mother    Depression Mother    Anxiety disorder Mother    Alcohol abuse Father     Social History   Socioeconomic History   Marital status: Significant Other    Spouse name: Not on file   Number of children: Not on file   Years of education: Not on file   Highest education level: Not on file  Occupational History   Not on file  Tobacco Use   Smoking status: Former    Packs/day: 0.50    Years: 0.50    Pack years: 0.25    Types: Cigarettes    Quit date: 12/12/2020    Years since quitting: 0.4   Smokeless tobacco: Never  Vaping Use   Vaping Use: Every day   Substances: Nicotine  Substance and Sexual Activity   Alcohol use: Yes    Comment: socially   Drug use: Not Currently   Sexual activity: Yes    Birth control/protection: Surgical  Other  Topics Concern   Not on file  Social History Narrative   Not on file   Social Determinants of Health   Financial Resource Strain: Not on file  Food Insecurity: Not on file  Transportation Needs: Not on file  Physical Activity: Not on file  Stress: Not on file  Social Connections: Not on file  Intimate Partner Violence: Not on file    Outpatient Medications Prior to Visit  Medication Sig Dispense Refill   Famotidine (PEPCID AC PO) Take by mouth.     fexofenadine (ALLEGRA) 180 MG tablet Take 180 mg by mouth daily.     hydrocortisone (ANUSOL-HC) 2.5 % rectal cream Place 1 application rectally 2 (two) times daily. 30 g 2   estradiol (VIVELLE-DOT) 0.05 MG/24HR patch Place 1 patch onto the skin 2 (two) times a week.     No facility-administered medications prior to visit.    Allergies  Allergen Reactions   Sulfa Antibiotics Anaphylaxis    ROS Review of Systems  Constitutional: Negative.   HENT: Negative.    Eyes: Negative.   Respiratory: Negative.    Cardiovascular: Negative.   All other systems reviewed and are negative.    Objective:    Physical Exam Vitals and nursing note reviewed.  Constitutional:      Appearance:  Normal appearance.  HENT:     Head: Normocephalic.     Nose: Nose normal.  Cardiovascular:     Rate and Rhythm: Normal rate and regular rhythm.     Pulses: Normal pulses.     Heart sounds: Normal heart sounds.  Pulmonary:     Effort: Pulmonary effort is normal.     Breath sounds: Normal breath sounds.  Abdominal:     General: Bowel sounds are normal.  Neurological:     Mental Status: She is alert and oriented to person, place, and time.  Psychiatric:        Behavior: Behavior normal.    BP 103/65   Pulse 64   Temp (!) 97.5 F (36.4 C) (Temporal)   Ht _0  (1.702 m)   Wt 211 lb (95.7 kg)   SpO2 97%   BMI 33.05 kg/m  Wt Readings from Last 3 Encounters:  05/20/21 211 lb (95.7 kg)  05/06/21 208 lb 6.4 oz (94.5 kg)  02/21/21 207 lb  (93.9 kg)     Health Maintenance Due  Topic Date Due   Pneumococcal Vaccine 3-7 Years old (1 - PCV) Never done   PAP SMEAR-Modifier  Never done   COLONOSCOPY (Pts 45-41yr Insurance coverage will need to be confirmed)  Never done   MAMMOGRAM  Never done   Zoster Vaccines- Shingrix (1 of 2) Never done   COVID-19 Vaccine (3 - Booster for PSmol[facility]eries) 09/28/2020    There are no preventive care reminders to display for this patient.  Lab Results  Component Value Date   TSH 3.300 02/10/2021   Lab Results  Component Value Date   WBC 6.2 02/10/2021   HGB 15.1 02/10/2021   HCT 43.7 02/10/2021   MCV 94 02/10/2021   PLT 212 02/10/2021   Lab Results  Component Value Date   NA 142 02/10/2021   K 4.4 02/10/2021   CO2 25 02/10/2021   GLUCOSE 106 (H) 02/10/2021   BUN 11 02/10/2021   CREATININE 1.13 (H) 02/10/2021   BILITOT 0.4 02/10/2021   ALKPHOS 58 02/10/2021   AST 62 (H) 02/10/2021   ALT 93 (H) 02/10/2021   PROT 6.8 02/10/2021   ALBUMIN 4.4 02/10/2021   CALCIUM 9.5 02/10/2021   ANIONGAP 8 09/14/2020   EGFR 57 (L) 02/10/2021   Lab Results  Component Value Date   CHOL 261 (H) 02/10/2021   Lab Results  Component Value Date   HDL 60 02/10/2021   Lab Results  Component Value Date   LDLCALC 166 (H) 02/10/2021   Lab Results  Component Value Date   TRIG 193 (H) 02/10/2021   Lab Results  Component Value Date   CHOLHDL 4.4 02/10/2021   No results found for: HGBA1C    Assessment & Plan:   Problem List Items Addressed This Visit       Other   Elevated cholesterol - Primary    Labs completed lipid panel results pending.  Continue low-cholesterol diet exercise and weight loss.  Education provided printed handouts given.       Relevant Orders   Lipid Panel   Elevated glucose    Repeat labs to reassess elevated blood glucose, CMP results pending.  Education provided printed handouts given       Relevant Orders   Comprehensive metabolic panel    Meds  ordered this encounter  Medications   estradiol (VIVELLE-DOT) 0.05 MG/24HR patch    Sig: Place 1 patch (0.05 mg total) onto the skin 2 (  two) times a week.    Dispense:  8 patch    Refill:  2    Order Specific Question:   Supervising Provider    Answer:   Janora Norlander [[id number]]    Follow-up: Return if symptoms worsen or fail to improve.    Ivy Lynn, NP

## 2021-05-21 ENCOUNTER — Other Ambulatory Visit: Payer: Self-pay | Admitting: Nurse Practitioner

## 2021-05-21 DIAGNOSIS — R748 Abnormal levels of other serum enzymes: Secondary | ICD-10-CM

## 2021-05-30 ENCOUNTER — Other Ambulatory Visit: Payer: Self-pay

## 2021-05-30 ENCOUNTER — Ambulatory Visit (HOSPITAL_COMMUNITY)
Admission: RE | Admit: 2021-05-30 | Discharge: 2021-05-30 | Disposition: A | Discharge status: Home / Self Care | Payer: BC Managed Care – PPO | Source: Ambulatory Visit | Attending: Nurse Practitioner | Admitting: Nurse Practitioner

## 2021-05-30 DIAGNOSIS — R748 Abnormal levels of other serum enzymes: Secondary | ICD-10-CM | POA: Insufficient documentation

## 2021-05-30 DIAGNOSIS — K760 Fatty (change of) liver, not elsewhere classified: Secondary | ICD-10-CM | POA: Diagnosis not present

## 2021-05-30 DIAGNOSIS — R945 Abnormal results of liver function studies: Secondary | ICD-10-CM | POA: Diagnosis not present

## 2021-06-13 ENCOUNTER — Other Ambulatory Visit: Payer: Self-pay

## 2021-06-13 ENCOUNTER — Other Ambulatory Visit: Payer: BC Managed Care – PPO

## 2021-06-13 DIAGNOSIS — R748 Abnormal levels of other serum enzymes: Secondary | ICD-10-CM | POA: Diagnosis not present

## 2021-06-14 LAB — HEPATIC FUNCTION PANEL
ALT: 155 IU/L — ABNORMAL HIGH (ref 0–32)
AST: 110 IU/L — ABNORMAL HIGH (ref 0–40)
Albumin: 4.1 g/dL (ref 3.8–4.9)
Alkaline Phosphatase: 62 IU/L (ref 44–121)
Bilirubin Total: 0.4 mg/dL (ref 0.0–1.2)
Bilirubin, Direct: 0.12 mg/dL (ref 0.00–0.40)
Total Protein: 6.5 g/dL (ref 6.0–8.5)

## 2021-06-14 LAB — HEPATITIS C ANTIBODY: Hep C Virus Ab: <0.1 s/co ratio (ref 0.0–0.9)

## 2021-06-14 LAB — HEPATITIS B SURFACE ANTIBODY,QUALITATIVE: Hep B Surface Ab, Qual: NONREACTIVE

## 2021-06-15 ENCOUNTER — Other Ambulatory Visit: Payer: Self-pay | Admitting: Nurse Practitioner

## 2021-06-15 DIAGNOSIS — R748 Abnormal levels of other serum enzymes: Secondary | ICD-10-CM

## 2021-06-21 ENCOUNTER — Encounter: Payer: Self-pay | Admitting: Internal Medicine

## 2021-07-11 ENCOUNTER — Other Ambulatory Visit: Payer: Self-pay | Admitting: Nurse Practitioner

## 2021-07-11 DIAGNOSIS — Z0000 Encounter for general adult medical examination without abnormal findings: Secondary | ICD-10-CM

## 2021-07-13 ENCOUNTER — Ambulatory Visit
Admission: RE | Admit: 2021-07-13 | Discharge: 2021-07-13 | Disposition: A | Discharge status: Home / Self Care | Payer: BC Managed Care – PPO | Source: Ambulatory Visit | Attending: Nurse Practitioner | Admitting: Nurse Practitioner

## 2021-07-13 ENCOUNTER — Other Ambulatory Visit: Payer: Self-pay

## 2021-07-13 DIAGNOSIS — Z1231 Encounter for screening mammogram for malignant neoplasm of breast: Secondary | ICD-10-CM | POA: Diagnosis not present

## 2021-07-13 DIAGNOSIS — Z0000 Encounter for general adult medical examination without abnormal findings: Secondary | ICD-10-CM

## 2021-07-14 DIAGNOSIS — M546 Pain in thoracic spine: Secondary | ICD-10-CM | POA: Diagnosis not present

## 2021-07-14 DIAGNOSIS — M5416 Radiculopathy, lumbar region: Secondary | ICD-10-CM | POA: Diagnosis not present

## 2021-07-14 DIAGNOSIS — M25551 Pain in right hip: Secondary | ICD-10-CM | POA: Diagnosis not present

## 2021-07-14 DIAGNOSIS — M461 Sacroiliitis, not elsewhere classified: Secondary | ICD-10-CM | POA: Diagnosis not present

## 2021-07-27 DIAGNOSIS — M25551 Pain in right hip: Secondary | ICD-10-CM | POA: Diagnosis not present

## 2021-07-27 DIAGNOSIS — M546 Pain in thoracic spine: Secondary | ICD-10-CM | POA: Diagnosis not present

## 2021-07-27 DIAGNOSIS — M5416 Radiculopathy, lumbar region: Secondary | ICD-10-CM | POA: Diagnosis not present

## 2021-07-27 DIAGNOSIS — M461 Sacroiliitis, not elsewhere classified: Secondary | ICD-10-CM | POA: Diagnosis not present

## 2021-08-03 DIAGNOSIS — M546 Pain in thoracic spine: Secondary | ICD-10-CM | POA: Diagnosis not present

## 2021-08-03 DIAGNOSIS — M5416 Radiculopathy, lumbar region: Secondary | ICD-10-CM | POA: Diagnosis not present

## 2021-08-03 DIAGNOSIS — M461 Sacroiliitis, not elsewhere classified: Secondary | ICD-10-CM | POA: Diagnosis not present

## 2021-08-03 DIAGNOSIS — M25562 Pain in left knee: Secondary | ICD-10-CM | POA: Diagnosis not present

## 2021-08-03 NOTE — Progress Notes (Signed)
Pt calling to check on labs. Please call back

## 2021-08-03 NOTE — Progress Notes (Signed)
Pt calling back about mammo results.

## 2021-08-17 DIAGNOSIS — M5416 Radiculopathy, lumbar region: Secondary | ICD-10-CM | POA: Diagnosis not present

## 2021-08-17 DIAGNOSIS — M461 Sacroiliitis, not elsewhere classified: Secondary | ICD-10-CM | POA: Diagnosis not present

## 2021-08-17 DIAGNOSIS — M546 Pain in thoracic spine: Secondary | ICD-10-CM | POA: Diagnosis not present

## 2021-08-17 DIAGNOSIS — M25562 Pain in left knee: Secondary | ICD-10-CM | POA: Diagnosis not present

## 2021-09-18 IMAGING — US US ABDOMEN LIMITED
1 series · 14 of 25 positions shown · non-contrast
Comparison: None.

CLINICAL DATA: 54-year-old female with elevated liver enzymes.

EXAM:
ULTRASOUND ABDOMEN LIMITED RIGHT UPPER QUADRANT

[Series 1: us abdomen limited ruq (liver/gb) · 14 of 49 slices shown]
[im 1/49]
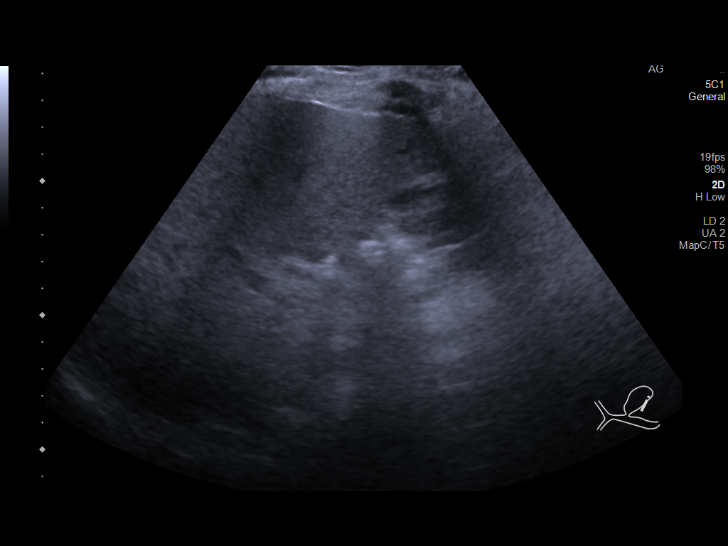
[im 5/49]
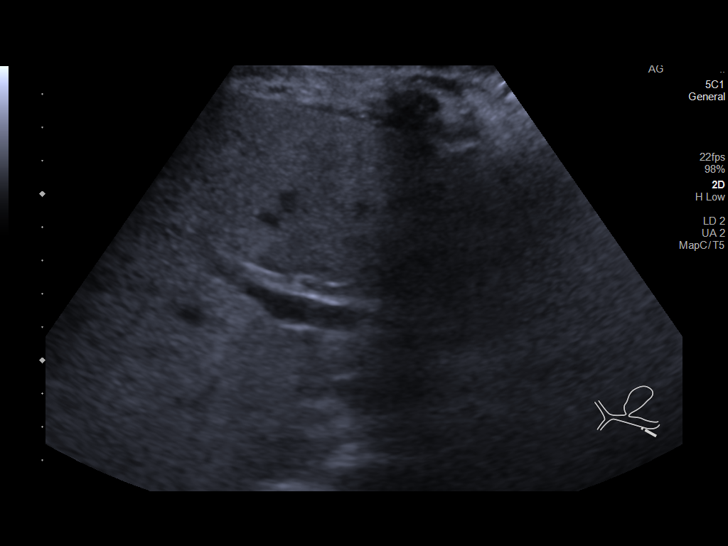
[im 9/49]
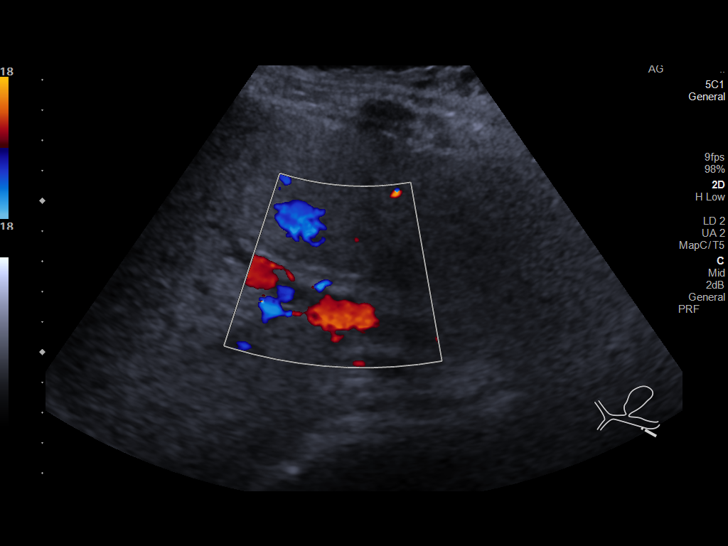
[im 13/49]
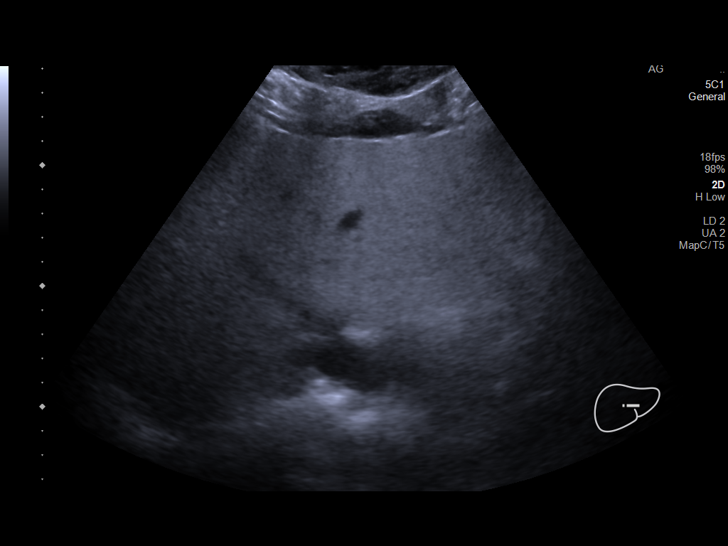
[im 17/49]
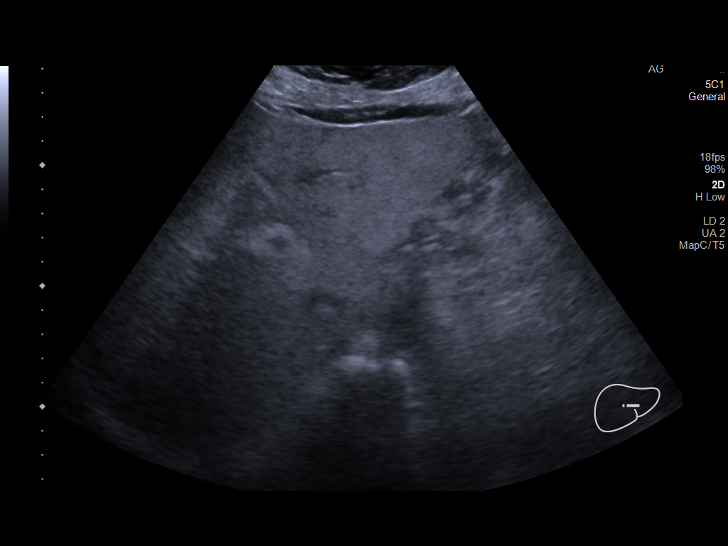
[im 19/49]
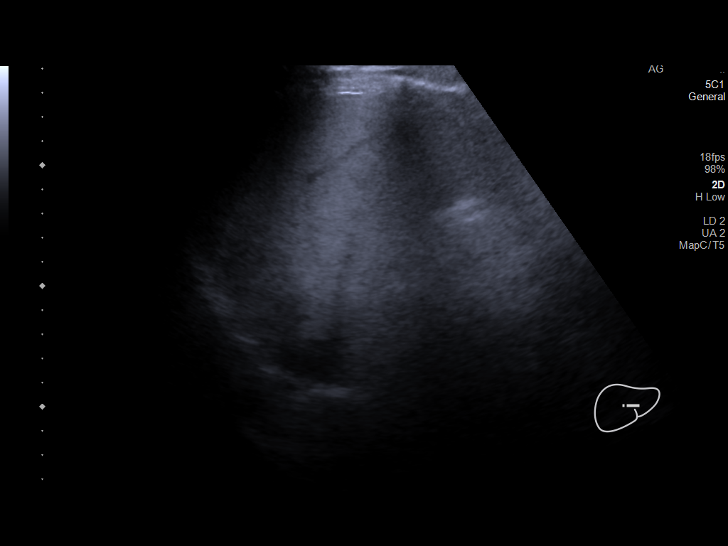
[im 23/49]
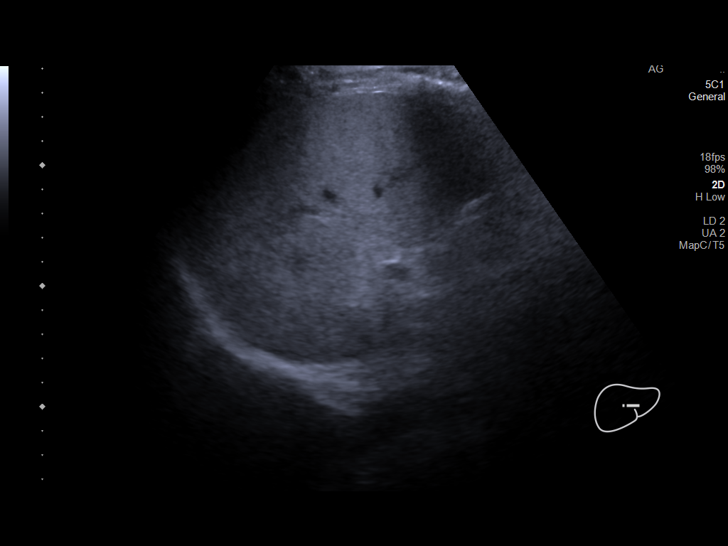
[im 27/49]
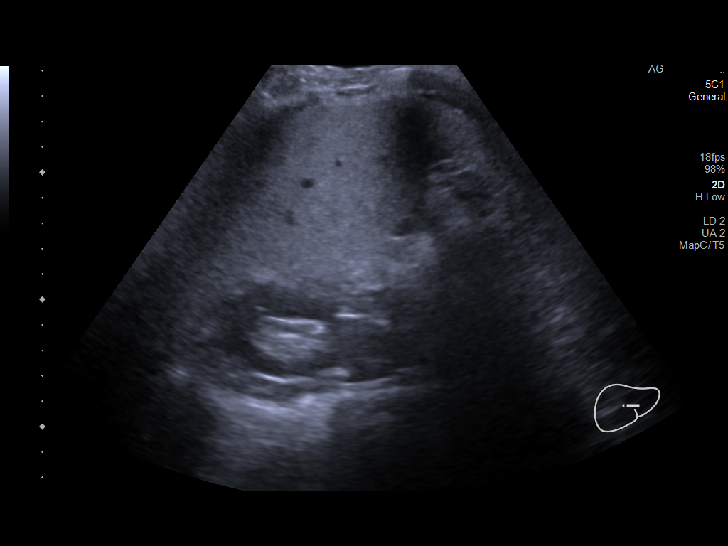
[im 31/49]
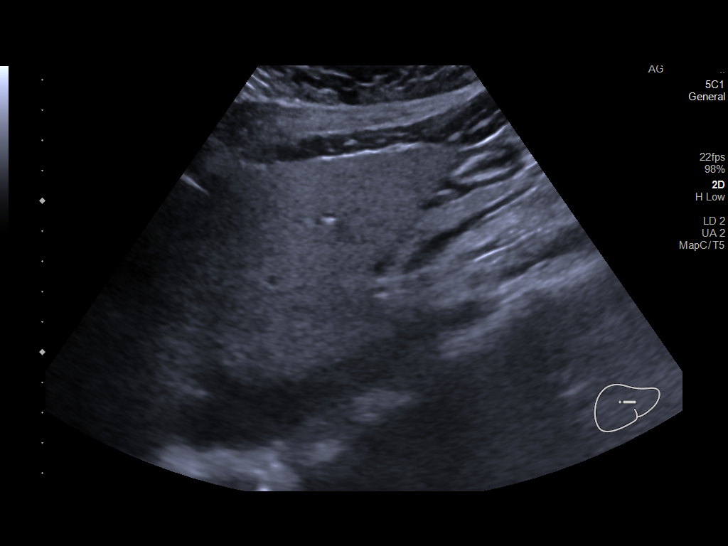
[im 33/49]
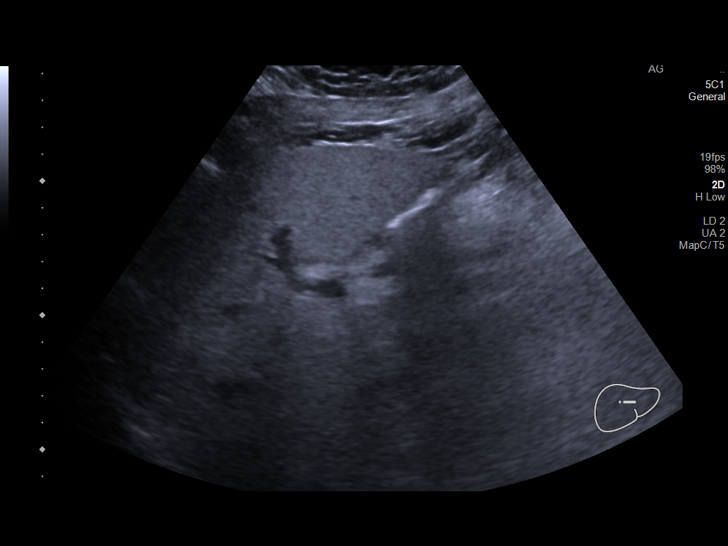
[im 37/49]
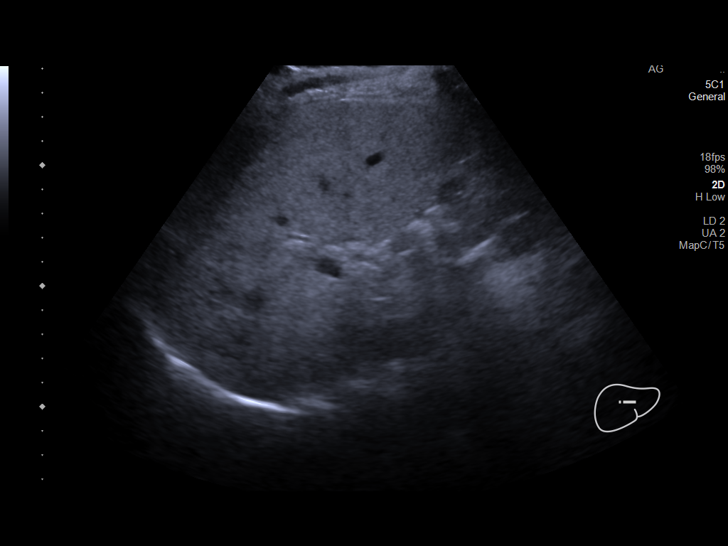
[im 41/49]
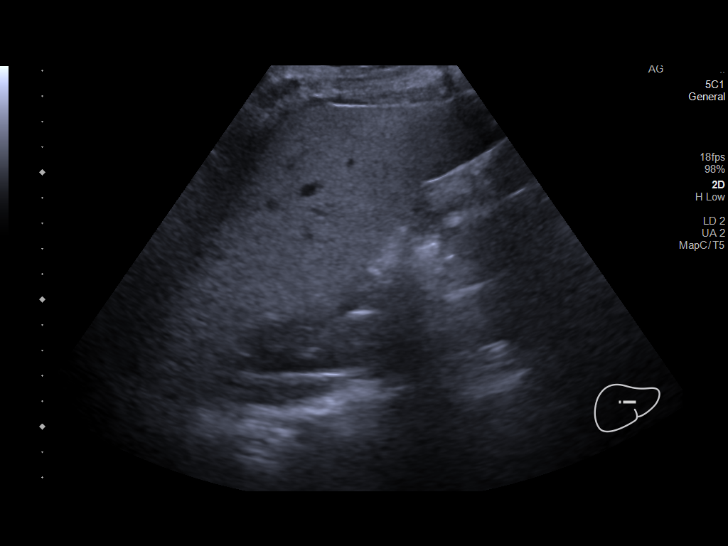
[im 45/49]
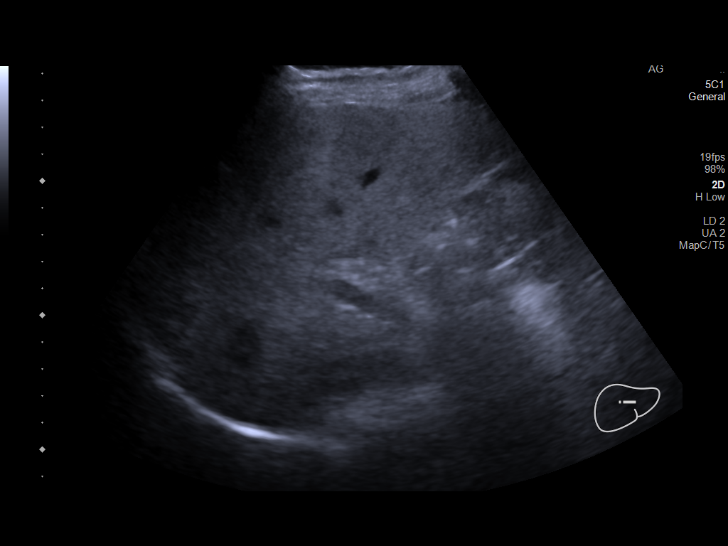
[im 49/49]
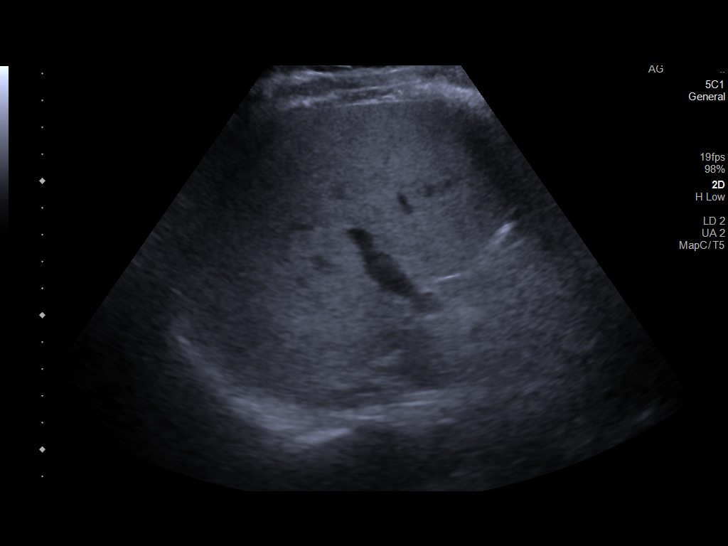

[14 of 25 positions shown; findings below may reference images not displayed]

FINDINGS: Gallbladder:

Cholecystectomy.

Common bile duct:

Diameter: 3 mm

Liver:

There is diffuse increased liver echogenicity most commonly seen in
the setting of fatty infiltration. Superimposed inflammation or
fibrosis is not excluded. Clinical correlation is recommended.
Portal vein is patent on color Doppler imaging with normal direction
of blood flow towards the liver.

Other: None.
IMPRESSION: 1. Fatty liver.
2. Cholecystectomy.

## 2021-09-21 DIAGNOSIS — M461 Sacroiliitis, not elsewhere classified: Secondary | ICD-10-CM | POA: Diagnosis not present

## 2021-09-21 DIAGNOSIS — M546 Pain in thoracic spine: Secondary | ICD-10-CM | POA: Diagnosis not present

## 2021-09-21 DIAGNOSIS — M5416 Radiculopathy, lumbar region: Secondary | ICD-10-CM | POA: Diagnosis not present

## 2021-09-21 DIAGNOSIS — M25551 Pain in right hip: Secondary | ICD-10-CM | POA: Diagnosis not present

## 2021-10-12 DIAGNOSIS — M461 Sacroiliitis, not elsewhere classified: Secondary | ICD-10-CM | POA: Diagnosis not present

## 2021-10-12 DIAGNOSIS — M25551 Pain in right hip: Secondary | ICD-10-CM | POA: Diagnosis not present

## 2021-10-12 DIAGNOSIS — M5416 Radiculopathy, lumbar region: Secondary | ICD-10-CM | POA: Diagnosis not present

## 2021-10-12 DIAGNOSIS — M546 Pain in thoracic spine: Secondary | ICD-10-CM | POA: Diagnosis not present

## 2021-10-19 ENCOUNTER — Ambulatory Visit: Payer: BC Managed Care – PPO | Admitting: Gastroenterology

## 2021-10-25 DIAGNOSIS — M5416 Radiculopathy, lumbar region: Secondary | ICD-10-CM | POA: Diagnosis not present

## 2021-10-25 DIAGNOSIS — M461 Sacroiliitis, not elsewhere classified: Secondary | ICD-10-CM | POA: Diagnosis not present

## 2021-10-25 DIAGNOSIS — M25551 Pain in right hip: Secondary | ICD-10-CM | POA: Diagnosis not present

## 2021-10-25 DIAGNOSIS — M546 Pain in thoracic spine: Secondary | ICD-10-CM | POA: Diagnosis not present

## 2021-11-17 DIAGNOSIS — R102 Pelvic and perineal pain: Secondary | ICD-10-CM | POA: Diagnosis not present

## 2021-11-17 DIAGNOSIS — N3010 Interstitial cystitis (chronic) without hematuria: Secondary | ICD-10-CM | POA: Diagnosis not present

## 2021-11-17 DIAGNOSIS — R300 Dysuria: Secondary | ICD-10-CM | POA: Diagnosis not present

## 2021-12-12 ENCOUNTER — Other Ambulatory Visit: Payer: Self-pay | Admitting: Nurse Practitioner

## 2022-03-23 DIAGNOSIS — Z299 Encounter for prophylactic measures, unspecified: Secondary | ICD-10-CM | POA: Diagnosis not present

## 2022-03-23 DIAGNOSIS — F419 Anxiety disorder, unspecified: Secondary | ICD-10-CM | POA: Diagnosis not present

## 2022-03-23 DIAGNOSIS — K219 Gastro-esophageal reflux disease without esophagitis: Secondary | ICD-10-CM | POA: Diagnosis not present

## 2022-03-23 DIAGNOSIS — J309 Allergic rhinitis, unspecified: Secondary | ICD-10-CM | POA: Diagnosis not present

## 2022-03-23 DIAGNOSIS — Z6831 Body mass index (BMI) 31.0-31.9, adult: Secondary | ICD-10-CM | POA: Diagnosis not present

## 2022-03-23 DIAGNOSIS — Z789 Other specified health status: Secondary | ICD-10-CM | POA: Diagnosis not present

## 2022-03-23 DIAGNOSIS — F339 Major depressive disorder, recurrent, unspecified: Secondary | ICD-10-CM | POA: Diagnosis not present

## 2022-05-05 DIAGNOSIS — J309 Allergic rhinitis, unspecified: Secondary | ICD-10-CM | POA: Diagnosis not present

## 2022-05-05 DIAGNOSIS — F339 Major depressive disorder, recurrent, unspecified: Secondary | ICD-10-CM | POA: Diagnosis not present

## 2022-05-05 DIAGNOSIS — G4700 Insomnia, unspecified: Secondary | ICD-10-CM | POA: Diagnosis not present

## 2022-05-05 DIAGNOSIS — Z299 Encounter for prophylactic measures, unspecified: Secondary | ICD-10-CM | POA: Diagnosis not present

## 2022-05-30 ENCOUNTER — Other Ambulatory Visit: Payer: Self-pay | Admitting: Internal Medicine

## 2022-05-30 DIAGNOSIS — Z1231 Encounter for screening mammogram for malignant neoplasm of breast: Secondary | ICD-10-CM

## 2022-06-27 ENCOUNTER — Other Ambulatory Visit: Payer: Self-pay | Admitting: Nurse Practitioner

## 2022-07-13 DIAGNOSIS — J209 Acute bronchitis, unspecified: Secondary | ICD-10-CM | POA: Diagnosis not present

## 2022-07-13 DIAGNOSIS — F339 Major depressive disorder, recurrent, unspecified: Secondary | ICD-10-CM | POA: Diagnosis not present

## 2022-07-13 DIAGNOSIS — Z299 Encounter for prophylactic measures, unspecified: Secondary | ICD-10-CM | POA: Diagnosis not present

## 2022-07-13 DIAGNOSIS — Z780 Asymptomatic menopausal state: Secondary | ICD-10-CM | POA: Diagnosis not present

## 2022-07-13 DIAGNOSIS — K219 Gastro-esophageal reflux disease without esophagitis: Secondary | ICD-10-CM | POA: Diagnosis not present

## 2022-07-19 ENCOUNTER — Ambulatory Visit
Admission: RE | Admit: 2022-07-19 | Discharge: 2022-07-19 | Disposition: A | Discharge status: Home / Self Care | Payer: BC Managed Care – PPO | Source: Ambulatory Visit | Attending: Internal Medicine | Admitting: Internal Medicine

## 2022-07-19 DIAGNOSIS — Z1231 Encounter for screening mammogram for malignant neoplasm of breast: Secondary | ICD-10-CM | POA: Diagnosis not present

## 2022-07-26 DIAGNOSIS — M25572 Pain in left ankle and joints of left foot: Secondary | ICD-10-CM | POA: Diagnosis not present

## 2022-08-04 DIAGNOSIS — E559 Vitamin D deficiency, unspecified: Secondary | ICD-10-CM | POA: Diagnosis not present

## 2022-08-04 DIAGNOSIS — Z6829 Body mass index (BMI) 29.0-29.9, adult: Secondary | ICD-10-CM | POA: Diagnosis not present

## 2022-08-04 DIAGNOSIS — Z299 Encounter for prophylactic measures, unspecified: Secondary | ICD-10-CM | POA: Diagnosis not present

## 2022-08-04 DIAGNOSIS — F17210 Nicotine dependence, cigarettes, uncomplicated: Secondary | ICD-10-CM | POA: Diagnosis not present

## 2022-08-04 DIAGNOSIS — F419 Anxiety disorder, unspecified: Secondary | ICD-10-CM | POA: Diagnosis not present

## 2022-08-04 DIAGNOSIS — Z1331 Encounter for screening for depression: Secondary | ICD-10-CM | POA: Diagnosis not present

## 2022-08-04 DIAGNOSIS — Z0000 Encounter for general adult medical examination without abnormal findings: Secondary | ICD-10-CM | POA: Diagnosis not present

## 2022-08-09 DIAGNOSIS — M25572 Pain in left ankle and joints of left foot: Secondary | ICD-10-CM | POA: Diagnosis not present

## 2022-08-11 DIAGNOSIS — E2839 Other primary ovarian failure: Secondary | ICD-10-CM | POA: Diagnosis not present

## 2022-08-23 DIAGNOSIS — R300 Dysuria: Secondary | ICD-10-CM | POA: Diagnosis not present

## 2022-08-23 DIAGNOSIS — N390 Urinary tract infection, site not specified: Secondary | ICD-10-CM | POA: Diagnosis not present

## 2022-08-23 DIAGNOSIS — M25572 Pain in left ankle and joints of left foot: Secondary | ICD-10-CM | POA: Diagnosis not present

## 2022-09-12 DIAGNOSIS — L814 Other melanin hyperpigmentation: Secondary | ICD-10-CM | POA: Diagnosis not present

## 2022-09-12 DIAGNOSIS — L578 Other skin changes due to chronic exposure to nonionizing radiation: Secondary | ICD-10-CM | POA: Diagnosis not present

## 2022-09-12 DIAGNOSIS — D2371 Other benign neoplasm of skin of right lower limb, including hip: Secondary | ICD-10-CM | POA: Diagnosis not present

## 2022-09-12 DIAGNOSIS — L719 Rosacea, unspecified: Secondary | ICD-10-CM | POA: Diagnosis not present

## 2022-09-12 DIAGNOSIS — D485 Neoplasm of uncertain behavior of skin: Secondary | ICD-10-CM | POA: Diagnosis not present

## 2022-09-12 DIAGNOSIS — L821 Other seborrheic keratosis: Secondary | ICD-10-CM | POA: Diagnosis not present

## 2022-09-13 DIAGNOSIS — M25572 Pain in left ankle and joints of left foot: Secondary | ICD-10-CM | POA: Diagnosis not present

## 2022-11-11 ENCOUNTER — Emergency Department (HOSPITAL_COMMUNITY)
Admission: EM | Admit: 2022-11-11 | Discharge: 2022-11-11 | Discharge status: Against Medical Advice | Payer: BC Managed Care – PPO | Attending: Emergency Medicine | Admitting: Emergency Medicine

## 2022-11-11 ENCOUNTER — Encounter (HOSPITAL_COMMUNITY): Payer: Self-pay

## 2022-11-11 DIAGNOSIS — R1032 Left lower quadrant pain: Secondary | ICD-10-CM | POA: Insufficient documentation

## 2022-11-11 DIAGNOSIS — K5900 Constipation, unspecified: Secondary | ICD-10-CM | POA: Diagnosis not present

## 2022-11-11 DIAGNOSIS — Z5321 Procedure and treatment not carried out due to patient leaving prior to being seen by health care provider: Secondary | ICD-10-CM | POA: Insufficient documentation

## 2022-11-11 LAB — COMPREHENSIVE METABOLIC PANEL
ALT: 21 U/L (ref 0–44)
AST: 18 U/L (ref 15–41)
Albumin: 3.5 g/dL (ref 3.5–5.0)
Alkaline Phosphatase: 60 U/L (ref 38–126)
Anion gap: 8 (ref 5–15)
BUN: 12 mg/dL (ref 6–20)
CO2: 26 mmol/L (ref 22–32)
Calcium: 9.3 mg/dL (ref 8.9–10.3)
Chloride: 102 mmol/L (ref 98–111)
Creatinine, Ser: 1.08 mg/dL — ABNORMAL HIGH (ref 0.44–1.00)
GFR, Estimated: 60 mL/min — ABNORMAL LOW (ref >60)
Glucose, Bld: 111 mg/dL — ABNORMAL HIGH (ref 70–99)
Potassium: 4.6 mmol/L (ref 3.5–5.1)
Sodium: 136 mmol/L (ref 135–145)
Total Bilirubin: 1.1 mg/dL (ref 0.3–1.2)
Total Protein: 7.1 g/dL (ref 6.5–8.1)

## 2022-11-11 LAB — CBC
HCT: 43.9 % (ref 36.0–46.0)
Hemoglobin: 14.7 g/dL (ref 12.0–15.0)
MCH: 32.7 pg (ref 26.0–34.0)
MCHC: 33.5 g/dL (ref 30.0–36.0)
MCV: 97.6 fL (ref 80.0–100.0)
Platelets: 215 10*3/uL (ref 150–400)
RBC: 4.5 MIL/uL (ref 3.87–5.11)
RDW: 12.4 % (ref 11.5–15.5)
WBC: 10.9 10*3/uL — ABNORMAL HIGH (ref 4.0–10.5)
nRBC: 0 % (ref 0.0–0.2)

## 2022-11-11 LAB — I-STAT BETA HCG BLOOD, ED (MC, WL, AP ONLY): I-stat hCG, quantitative: <5 m[IU]/mL (ref <5)

## 2022-11-11 LAB — LIPASE, BLOOD: Lipase: 34 U/L (ref 11–51)

## 2022-11-11 MED ORDER — OXYCODONE-ACETAMINOPHEN 5-325 MG PO TABS: 1.0000 | ORAL_TABLET | Freq: Once | ORAL | Status: DC

## 2022-11-11 MED ORDER — ACETAMINOPHEN 325 MG PO TABS
650.0000 mg | ORAL_TABLET | Freq: Once | ORAL | Status: AC
  Administered 2022-11-11: 650 mg via ORAL
  Filled 2022-11-11: qty 2

## 2022-11-11 NOTE — ED Provider Triage Note (Cosign Needed)
Emergency Medicine Provider Triage Evaluation Note  Tiffany Mcintosh , a 56 y.o. female  was evaluated in triage.  Pt complains of focal left lower quadrant pain for 2 days, endorses some constipation, she denies nausea, vomiting, fever, chills.  Patient reports it feels similar to previous history of diverticulitis, she reports she had to be hospitalized for 7 days last time she had it several years ago.  Patient has had total hysterectomy, cholecystectomy, and appendectomy. Denies dysuria, hematuria, vaginal discharge.  Review of Systems  Positive: Abdominal pain Negative: nvd  Physical Exam  BP (!) 145/71 (BP Location: Left Arm)   Pulse 85   Temp 98.4 F (36.9 C) (Oral)   Resp 16   SpO2 100%  Gen:   Awake, no distress   Resp:  Normal effort  MSK:   Moves extremities without difficulty  Other:  Focally ttp in LLQ, some guarding, no rebound, rigidity  Medical Decision Making  Medically screening exam initiated at 11:22 AM.  Appropriate orders placed.  Shatha Hooser was informed that the remainder of the evaluation will be completed by another provider, this initial triage assessment does not replace that evaluation, and the importance of remaining in the ED until their evaluation is complete.  Workup initiated   Olene Floss, [place] 11/11/22 1124

## 2022-11-11 NOTE — ED Triage Notes (Signed)
Pt presents with LLQ abdominal pain for 2 days, hx of diverticulitis. Pt has no vomiting or diarrhea.

## 2023-07-24 ENCOUNTER — Other Ambulatory Visit: Payer: Self-pay | Admitting: Internal Medicine

## 2023-07-24 DIAGNOSIS — Z1231 Encounter for screening mammogram for malignant neoplasm of breast: Secondary | ICD-10-CM

## 2023-07-31 ENCOUNTER — Ambulatory Visit
Admission: RE | Admit: 2023-07-31 | Discharge: 2023-07-31 | Disposition: A | Discharge status: Home / Self Care | Payer: BC Managed Care – PPO | Source: Ambulatory Visit | Attending: Internal Medicine | Admitting: Internal Medicine

## 2023-07-31 DIAGNOSIS — Z1231 Encounter for screening mammogram for malignant neoplasm of breast: Secondary | ICD-10-CM

## 2023-08-02 ENCOUNTER — Other Ambulatory Visit: Payer: Self-pay | Admitting: Internal Medicine

## 2023-08-02 DIAGNOSIS — R928 Other abnormal and inconclusive findings on diagnostic imaging of breast: Secondary | ICD-10-CM

## 2023-08-23 ENCOUNTER — Ambulatory Visit
Admission: RE | Admit: 2023-08-23 | Discharge: 2023-08-23 | Disposition: A | Discharge status: Home / Self Care | Payer: BC Managed Care – PPO | Source: Ambulatory Visit | Attending: Internal Medicine | Admitting: Internal Medicine

## 2023-08-23 DIAGNOSIS — R928 Other abnormal and inconclusive findings on diagnostic imaging of breast: Secondary | ICD-10-CM

## 2024-09-15 ENCOUNTER — Other Ambulatory Visit: Payer: Self-pay | Admitting: Internal Medicine

## 2024-09-15 DIAGNOSIS — Z1231 Encounter for screening mammogram for malignant neoplasm of breast: Secondary | ICD-10-CM

## 2024-09-16 ENCOUNTER — Inpatient Hospital Stay
Admission: RE | Admit: 2024-09-16 | Discharge: 2024-09-16 | Disposition: A | Source: Ambulatory Visit | Attending: Internal Medicine | Admitting: Internal Medicine

## 2024-09-16 DIAGNOSIS — Z1231 Encounter for screening mammogram for malignant neoplasm of breast: Secondary | ICD-10-CM
